# Patient Record
Sex: Female | Born: 1995 | Race: Black or African American | Hispanic: No | Marital: Single | Smoking: Never smoker
Health system: Southern US, Community
[De-identification: ages and names within clinical notes are randomized; demographics above are authoritative.]

## PROBLEM LIST (undated history)

## (undated) DIAGNOSIS — K219 Gastro-esophageal reflux disease without esophagitis: Secondary | ICD-10-CM

## (undated) DIAGNOSIS — J3081 Allergic rhinitis due to animal (cat) (dog) hair and dander: Secondary | ICD-10-CM

## (undated) DIAGNOSIS — G932 Benign intracranial hypertension: Secondary | ICD-10-CM

## (undated) DIAGNOSIS — G5600 Carpal tunnel syndrome, unspecified upper limb: Secondary | ICD-10-CM

## (undated) DIAGNOSIS — G43909 Migraine, unspecified, not intractable, without status migrainosus: Secondary | ICD-10-CM

## (undated) DIAGNOSIS — M797 Fibromyalgia: Secondary | ICD-10-CM

## (undated) HISTORY — DX: Gastro-esophageal reflux disease without esophagitis: K21.9

## (undated) HISTORY — PX: WISDOM TOOTH EXTRACTION: SHX21

## (undated) HISTORY — DX: Carpal tunnel syndrome, unspecified upper limb: G56.00

## (undated) HISTORY — DX: Allergic rhinitis due to animal (cat) (dog) hair and dander: J30.81

---
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
## 2014-01-12 ENCOUNTER — Ambulatory Visit: Payer: Self-pay | Admitting: Pediatrics

## 2014-01-18 ENCOUNTER — Ambulatory Visit: Payer: Self-pay | Admitting: Pediatrics

## 2014-02-06 ENCOUNTER — Encounter: Payer: Self-pay | Admitting: *Deleted

## 2015-11-06 ENCOUNTER — Encounter (HOSPITAL_COMMUNITY): Payer: Self-pay | Admitting: Emergency Medicine

## 2015-11-06 ENCOUNTER — Inpatient Hospital Stay (HOSPITAL_COMMUNITY)
Admission: EM | Admit: 2015-11-06 | Discharge: 2015-11-15 | DRG: 194 | Disposition: A | Discharge status: Home / Self Care | Payer: Medicaid Other | Attending: Internal Medicine | Admitting: Internal Medicine

## 2015-11-06 ENCOUNTER — Emergency Department (HOSPITAL_COMMUNITY): Payer: Medicaid Other

## 2015-11-06 ENCOUNTER — Other Ambulatory Visit: Payer: Self-pay

## 2015-11-06 ENCOUNTER — Other Ambulatory Visit (HOSPITAL_COMMUNITY): Payer: Self-pay

## 2015-11-06 DIAGNOSIS — R000 Tachycardia, unspecified: Secondary | ICD-10-CM | POA: Diagnosis present

## 2015-11-06 DIAGNOSIS — Z79899 Other long term (current) drug therapy: Secondary | ICD-10-CM

## 2015-11-06 DIAGNOSIS — F329 Major depressive disorder, single episode, unspecified: Secondary | ICD-10-CM | POA: Diagnosis not present

## 2015-11-06 DIAGNOSIS — J45901 Unspecified asthma with (acute) exacerbation: Secondary | ICD-10-CM | POA: Diagnosis not present

## 2015-11-06 DIAGNOSIS — G932 Benign intracranial hypertension: Secondary | ICD-10-CM | POA: Diagnosis present

## 2015-11-06 DIAGNOSIS — J181 Lobar pneumonia, unspecified organism: Secondary | ICD-10-CM

## 2015-11-06 DIAGNOSIS — Z6841 Body Mass Index (BMI) 40.0 and over, adult: Secondary | ICD-10-CM

## 2015-11-06 DIAGNOSIS — R0602 Shortness of breath: Secondary | ICD-10-CM | POA: Insufficient documentation

## 2015-11-06 DIAGNOSIS — Z823 Family history of stroke: Secondary | ICD-10-CM

## 2015-11-06 DIAGNOSIS — Z808 Family history of malignant neoplasm of other organs or systems: Secondary | ICD-10-CM

## 2015-11-06 DIAGNOSIS — B370 Candidal stomatitis: Secondary | ICD-10-CM | POA: Diagnosis not present

## 2015-11-06 DIAGNOSIS — Z7951 Long term (current) use of inhaled steroids: Secondary | ICD-10-CM

## 2015-11-06 DIAGNOSIS — G43909 Migraine, unspecified, not intractable, without status migrainosus: Secondary | ICD-10-CM | POA: Diagnosis present

## 2015-11-06 DIAGNOSIS — M797 Fibromyalgia: Secondary | ICD-10-CM | POA: Diagnosis present

## 2015-11-06 DIAGNOSIS — F32A Depression, unspecified: Secondary | ICD-10-CM | POA: Diagnosis present

## 2015-11-06 DIAGNOSIS — F419 Anxiety disorder, unspecified: Secondary | ICD-10-CM | POA: Diagnosis present

## 2015-11-06 DIAGNOSIS — Z885 Allergy status to narcotic agent status: Secondary | ICD-10-CM

## 2015-11-06 DIAGNOSIS — Z91018 Allergy to other foods: Secondary | ICD-10-CM

## 2015-11-06 DIAGNOSIS — D6489 Other specified anemias: Secondary | ICD-10-CM | POA: Diagnosis present

## 2015-11-06 DIAGNOSIS — Z7952 Long term (current) use of systemic steroids: Secondary | ICD-10-CM

## 2015-11-06 DIAGNOSIS — J189 Pneumonia, unspecified organism: Principal | ICD-10-CM

## 2015-11-06 DIAGNOSIS — E876 Hypokalemia: Secondary | ICD-10-CM | POA: Diagnosis present

## 2015-11-06 HISTORY — DX: Benign intracranial hypertension: G93.2

## 2015-11-06 LAB — CBC WITH DIFFERENTIAL/PLATELET
BASOS PCT: 0 %
Basophils Absolute: 0 10*3/uL (ref 0.0–0.1)
EOS ABS: 0 10*3/uL (ref 0.0–0.7)
EOS PCT: 0 %
HCT: 32.8 % — ABNORMAL LOW (ref 36.0–46.0)
Hemoglobin: 10.7 g/dL — ABNORMAL LOW (ref 12.0–15.0)
Lymphocytes Relative: 29 %
Lymphs Abs: 3.1 10*3/uL (ref 0.7–4.0)
MCH: 26.8 pg (ref 26.0–34.0)
MCHC: 32.6 g/dL (ref 30.0–36.0)
MCV: 82.2 fL (ref 78.0–100.0)
MONO ABS: 0.5 10*3/uL (ref 0.1–1.0)
MONOS PCT: 5 %
NEUTROS PCT: 66 %
Neutro Abs: 7.2 10*3/uL (ref 1.7–7.7)
PLATELETS: 311 10*3/uL (ref 150–400)
RBC: 3.99 MIL/uL (ref 3.87–5.11)
RDW: 15.8 % — AB (ref 11.5–15.5)
WBC: 10.9 10*3/uL — ABNORMAL HIGH (ref 4.0–10.5)

## 2015-11-06 LAB — HCG, QUANTITATIVE, PREGNANCY: hCG, Beta Chain, Quant, S: <1 m[IU]/mL (ref <5)

## 2015-11-06 LAB — BASIC METABOLIC PANEL
Anion gap: 6 (ref 5–15)
BUN: 11 mg/dL (ref 6–20)
CALCIUM: 8.9 mg/dL (ref 8.9–10.3)
CO2: 28 mmol/L (ref 22–32)
CREATININE: 0.7 mg/dL (ref 0.44–1.00)
Chloride: 104 mmol/L (ref 101–111)
GFR calc non Af Amer: >60 mL/min (ref >60)
Glucose, Bld: 111 mg/dL — ABNORMAL HIGH (ref 65–99)
Potassium: 3.3 mmol/L — ABNORMAL LOW (ref 3.5–5.1)
SODIUM: 138 mmol/L (ref 135–145)

## 2015-11-06 LAB — I-STAT CHEM 8, ED
BUN: 10 mg/dL (ref 6–20)
CALCIUM ION: 1.12 mmol/L (ref 1.12–1.23)
CHLORIDE: 102 mmol/L (ref 101–111)
CREATININE: 0.7 mg/dL (ref 0.44–1.00)
Glucose, Bld: 98 mg/dL (ref 65–99)
HEMATOCRIT: 33 % — AB (ref 36.0–46.0)
Hemoglobin: 11.2 g/dL — ABNORMAL LOW (ref 12.0–15.0)
Potassium: 3.5 mmol/L (ref 3.5–5.1)
SODIUM: 141 mmol/L (ref 135–145)
TCO2: 26 mmol/L (ref 0–100)

## 2015-11-06 LAB — STREP PNEUMONIAE URINARY ANTIGEN: Strep Pneumo Urinary Antigen: NEGATIVE

## 2015-11-06 MED ORDER — IPRATROPIUM-ALBUTEROL 0.5-2.5 (3) MG/3ML IN SOLN
3.0000 mL | Freq: Four times a day (QID) | RESPIRATORY_TRACT | Status: DC
  Administered 2015-11-06 – 2015-11-09 (×12): 3 mL via RESPIRATORY_TRACT
  Filled 2015-11-06 (×13): qty 3

## 2015-11-06 MED ORDER — POTASSIUM CHLORIDE 20 MEQ/15ML (10%) PO SOLN
20.0000 meq | Freq: Once | ORAL | Status: AC
  Administered 2015-11-06: 20 meq via ORAL
  Filled 2015-11-06: qty 15

## 2015-11-06 MED ORDER — DM-DOXYLAMINE-ACETAMINOPHEN 15-6.25-325 MG/15ML PO LIQD: 30.0000 mL | Freq: Every evening | ORAL | Status: DC | PRN

## 2015-11-06 MED ORDER — MONTELUKAST SODIUM 10 MG PO TABS
10.0000 mg | ORAL_TABLET | Freq: Every day | ORAL | Status: DC
  Administered 2015-11-06 – 2015-11-14 (×9): 10 mg via ORAL
  Filled 2015-11-06 (×10): qty 1

## 2015-11-06 MED ORDER — SODIUM CHLORIDE 0.9 % IV SOLN: INTRAVENOUS | Status: DC

## 2015-11-06 MED ORDER — SODIUM CHLORIDE 0.9 % IV SOLN
INTRAVENOUS | Status: DC
  Administered 2015-11-06: 11:00:00 via INTRAVENOUS

## 2015-11-06 MED ORDER — KETOROLAC TROMETHAMINE 30 MG/ML IJ SOLN
30.0000 mg | Freq: Once | INTRAMUSCULAR | Status: AC
  Administered 2015-11-06: 30 mg via INTRAVENOUS
  Filled 2015-11-06: qty 1

## 2015-11-06 MED ORDER — FLUTICASONE PROPIONATE 50 MCG/ACT NA SUSP
1.0000 | Freq: Every day | NASAL | Status: DC
  Administered 2015-11-06 – 2015-11-09 (×4): 1 via NASAL
  Filled 2015-11-06: qty 16

## 2015-11-06 MED ORDER — GUAIFENESIN-DM 100-10 MG/5ML PO SYRP
5.0000 mL | ORAL_SOLUTION | ORAL | Status: DC | PRN
  Administered 2015-11-06 – 2015-11-13 (×16): 5 mL via ORAL
  Filled 2015-11-06 (×18): qty 10

## 2015-11-06 MED ORDER — PREDNISONE 20 MG PO TABS
60.0000 mg | ORAL_TABLET | Freq: Once | ORAL | Status: AC
  Administered 2015-11-06: 60 mg via ORAL
  Filled 2015-11-06: qty 3

## 2015-11-06 MED ORDER — AZITHROMYCIN 250 MG PO TABS
500.0000 mg | ORAL_TABLET | Freq: Every day | ORAL | Status: AC
  Administered 2015-11-06: 500 mg via ORAL
  Filled 2015-11-06: qty 2

## 2015-11-06 MED ORDER — IPRATROPIUM-ALBUTEROL 0.5-2.5 (3) MG/3ML IN SOLN
3.0000 mL | Freq: Once | RESPIRATORY_TRACT | Status: AC
  Administered 2015-11-06: 3 mL via RESPIRATORY_TRACT
  Filled 2015-11-06: qty 3

## 2015-11-06 MED ORDER — OLOPATADINE HCL 0.1 % OP SOLN
1.0000 [drp] | Freq: Two times a day (BID) | OPHTHALMIC | Status: DC
  Administered 2015-11-07 – 2015-11-15 (×15): 1 [drp] via OPHTHALMIC
  Filled 2015-11-06: qty 5

## 2015-11-06 MED ORDER — IPRATROPIUM-ALBUTEROL 0.5-2.5 (3) MG/3ML IN SOLN
3.0000 mL | RESPIRATORY_TRACT | Status: DC
  Administered 2015-11-06: 3 mL via RESPIRATORY_TRACT
  Filled 2015-11-06: qty 3

## 2015-11-06 MED ORDER — AZITHROMYCIN 250 MG PO TABS
250.0000 mg | ORAL_TABLET | Freq: Every day | ORAL | Status: AC
  Administered 2015-11-07 – 2015-11-10 (×4): 250 mg via ORAL
  Filled 2015-11-06 (×4): qty 1

## 2015-11-06 MED ORDER — LORATADINE 10 MG PO TABS
10.0000 mg | ORAL_TABLET | Freq: Every day | ORAL | Status: DC
  Administered 2015-11-06 – 2015-11-15 (×9): 10 mg via ORAL
  Filled 2015-11-06 (×10): qty 1

## 2015-11-06 MED ORDER — ENOXAPARIN SODIUM 40 MG/0.4ML ~~LOC~~ SOLN
40.0000 mg | SUBCUTANEOUS | Status: DC
  Administered 2015-11-06 – 2015-11-11 (×6): 40 mg via SUBCUTANEOUS
  Filled 2015-11-06 (×7): qty 0.4

## 2015-11-06 MED ORDER — ALBUTEROL SULFATE (2.5 MG/3ML) 0.083% IN NEBU
2.5000 mg | INHALATION_SOLUTION | RESPIRATORY_TRACT | Status: DC | PRN
  Administered 2015-11-06 – 2015-11-10 (×13): 2.5 mg via RESPIRATORY_TRACT
  Filled 2015-11-06 (×13): qty 3

## 2015-11-06 MED ORDER — DM-GUAIFENESIN ER 30-600 MG PO TB12
1.0000 | ORAL_TABLET | Freq: Two times a day (BID) | ORAL | Status: DC
  Administered 2015-11-06 – 2015-11-15 (×18): 1 via ORAL
  Filled 2015-11-06 (×19): qty 1

## 2015-11-06 MED ORDER — METHYLPREDNISOLONE SODIUM SUCC 125 MG IJ SOLR
60.0000 mg | Freq: Three times a day (TID) | INTRAMUSCULAR | Status: DC
  Administered 2015-11-06 – 2015-11-12 (×18): 60 mg via INTRAVENOUS
  Filled 2015-11-06 (×18): qty 2

## 2015-11-06 MED ORDER — IBUPROFEN 800 MG PO TABS
800.0000 mg | ORAL_TABLET | Freq: Four times a day (QID) | ORAL | Status: DC | PRN
  Administered 2015-11-06 – 2015-11-14 (×7): 800 mg via ORAL
  Filled 2015-11-06 (×7): qty 1

## 2015-11-06 NOTE — ED Provider Notes (Signed)
CSN: [id number]     Arrival date & time 11/06/15  1012 History   First MD Initiated Contact with Patient 11/06/15 1035     Chief Complaint  Patient presents with  . Shortness of Breath  . Asthma     (Consider location/radiation/quality/duration/timing/severity/associated sxs/prior Treatment) Patient is a 20 y.o. female presenting with shortness of breath and asthma. The history is provided by the patient.  Shortness of Breath Associated symptoms: chest pain, cough, sore throat and wheezing   Associated symptoms: no abdominal pain, no fever, no headaches, no rash and no vomiting   Asthma Associated symptoms include chest pain and shortness of breath. Pertinent negatives include no abdominal pain and no headaches.  Patient with asthma flareup for the past several days. Seen by primary care Dr. started on prednisone 3 days ago. Still no better. Still wheezing. No distinct fevers but may be related to an upper respiratory infection. And/or allergies. Patient's been using inhalers and patient has been on prednisone.  Past Medical History  Diagnosis Date  . Migraines   . Asthma   . Fibromyalgia   . Pseudotumor cerebri    History reviewed. No pertinent past surgical history. Family History  Problem Relation Age of Onset  . Migraines Mother   . Thyroid cancer Mother   . Fibromyalgia Mother   . Migraines Maternal Aunt   . Stroke Maternal Grandmother   . Other Cousin     Myasthenia gravis   Social History  Substance Use Topics  . Smoking status: Never Smoker   . Smokeless tobacco: None  . Alcohol Use: None   OB History    No data available     Review of Systems  Constitutional: Negative for fever.  HENT: Positive for sore throat.   Eyes: Negative for visual disturbance.  Respiratory: Positive for cough, shortness of breath and wheezing.   Cardiovascular: Positive for chest pain.  Gastrointestinal: Negative for nausea, vomiting and abdominal pain.  Genitourinary: Negative  for dysuria.  Musculoskeletal: Positive for back pain.  Skin: Negative for rash.  Neurological: Negative for headaches.  Hematological: Does not bruise/bleed easily.  Psychiatric/Behavioral: Negative for confusion.      Allergies  Kiwi extract; Pineapple; and Percocet  Home Medications   Prior to Admission medications   Medication Sig Start Date End Date Taking? Authorizing Provider  albuterol (PROVENTIL HFA;VENTOLIN HFA) 108 (90 BASE) MCG/ACT inhaler Inhale 2 puffs into the lungs every 4 (four) hours as needed for wheezing or shortness of breath.    Yes Historical Provider, MD  albuterol (PROVENTIL) (2.5 MG/3ML) 0.083% nebulizer solution Take 2.5 mg by nebulization every 6 (six) hours as needed for wheezing or shortness of breath.   Yes Historical Provider, MD  beclomethasone (QVAR) 40 MCG/ACT inhaler Inhale 2 puffs into the lungs 2 (two) times daily.     Yes Historical Provider, MD  cetirizine (ZYRTEC) 10 MG tablet Take 10 mg by mouth daily.     Yes Historical Provider, MD  DM-Doxylamine-Acetaminophen (VICKS NYQUIL COLD & FLU) 15-6.25-325 MG/15ML LIQD Take 30 mLs by mouth at bedtime as needed (for cold/sleep).   Yes Historical Provider, MD  fluticasone (FLONASE) 50 MCG/ACT nasal spray Place 1 spray into both nostrils daily.   Yes Historical Provider, MD  ibuprofen (ADVIL,MOTRIN) 800 MG tablet Take 800 mg by mouth every 6 (six) hours as needed for fever, headache, mild pain, moderate pain or cramping.   Yes Historical Provider, MD  montelukast (SINGULAIR) 10 MG tablet Take 10 mg by mouth  at bedtime.   Yes Historical Provider, MD  Olopatadine HCl (PATADAY) 0.2 % SOLN Place 2 drops into both eyes daily.   Yes Historical Provider, MD  predniSONE (DELTASONE) 20 MG tablet Take 20 mg by mouth 2 (two) times daily with a meal. Started 06/19 for 5 days   Yes Historical Provider, MD  acetaZOLAMIDE (DIAMOX) 250 MG tablet One by mouth 4 times daily with meals and at bedtime. Patient not taking:  Reported on 11/06/2015 12/14/12   Deetta Perla, MD  DULoxetine (CYMBALTA) 20 MG capsule TAKE 2 CAPS BY MOUTH DAILY Patient not taking: Reported on 11/06/2015 04/11/13   Elveria Rising, NP  furosemide (LASIX) 40 MG tablet Take 1+1/2 tablets every morning Patient not taking: Reported on 11/06/2015 04/11/13   Elveria Rising, NP  traMADol (ULTRAM) 50 MG tablet TAKE 2 TABLETS BY MOUTH AT BEDTIME Patient not taking: Reported on 11/06/2015 04/11/13   Elveria Rising, NP   BP 124/65 mmHg  Pulse 92  Temp(Src) 98.6 F (37 C) (Oral)  Resp 23  SpO2 98%  LMP 10/28/2015 (Approximate) Physical Exam  Constitutional: She is oriented to person, place, and time. She appears well-developed and well-nourished. No distress.  HENT:  Head: Normocephalic and atraumatic.  Mouth/Throat: Oropharynx is clear and moist.  Eyes: Conjunctivae are normal. Pupils are equal, round, and reactive to light.  Neck: Normal range of motion. Neck supple.  Cardiovascular: Normal rate, regular rhythm and normal heart sounds.   No murmur heard. Pulmonary/Chest: Effort normal. She has wheezes.  Abdominal: Soft. There is no tenderness.  Musculoskeletal: Normal range of motion. She exhibits no edema.  Neurological: She is alert and oriented to person, place, and time. No cranial nerve deficit. She exhibits normal muscle tone. Coordination normal.  Nursing note and vitals reviewed.   ED Course  Procedures (including critical care time) Labs Review Labs Reviewed  CBC WITH DIFFERENTIAL/PLATELET - Abnormal; Notable for the following:    WBC 10.9 (*)    Hemoglobin 10.7 (*)    HCT 32.8 (*)    RDW 15.8 (*)    All other components within normal limits  BASIC METABOLIC PANEL - Abnormal; Notable for the following:    Potassium 3.3 (*)    Glucose, Bld 111 (*)    All other components within normal limits  I-STAT CHEM 8, ED - Abnormal; Notable for the following:    Hemoglobin 11.2 (*)    HCT 33.0 (*)    All other components  within normal limits   Results for orders placed or performed during the hospital encounter of 11/06/15  CBC with Differential/Platelet  Result Value Ref Range   WBC 10.9 (H) 4.0 - 10.5 K/uL   RBC 3.99 3.87 - 5.11 MIL/uL   Hemoglobin 10.7 (L) 12.0 - 15.0 g/dL   HCT 40.9 (L) 81.1 - 91.4 %   MCV 82.2 78.0 - 100.0 fL   MCH 26.8 26.0 - 34.0 pg   MCHC 32.6 30.0 - 36.0 g/dL   RDW 78.2 (H) 95.6 - 21.3 %   Platelets 311 150 - 400 K/uL   Neutrophils Relative % 66 %   Neutro Abs 7.2 1.7 - 7.7 K/uL   Lymphocytes Relative 29 %   Lymphs Abs 3.1 0.7 - 4.0 K/uL   Monocytes Relative 5 %   Monocytes Absolute 0.5 0.1 - 1.0 K/uL   Eosinophils Relative 0 %   Eosinophils Absolute 0.0 0.0 - 0.7 K/uL   Basophils Relative 0 %   Basophils Absolute 0.0 0.0 -  0.1 K/uL  Basic metabolic panel  Result Value Ref Range   Sodium 138 135 - 145 mmol/L   Potassium 3.3 (L) 3.5 - 5.1 mmol/L   Chloride 104 101 - 111 mmol/L   CO2 28 22 - 32 mmol/L   Glucose, Bld 111 (H) 65 - 99 mg/dL   BUN 11 6 - 20 mg/dL   Creatinine, Ser 6.040.70 0.44 - 1.00 mg/dL   Calcium 8.9 8.9 - 54.010.3 mg/dL   GFR calc non Af Amer >60 >60 mL/min   GFR calc Af Amer >60 >60 mL/min   Anion gap 6 5 - 15  I-Stat Chem 8, ED  Result Value Ref Range   Sodium 141 135 - 145 mmol/L   Potassium 3.5 3.5 - 5.1 mmol/L   Chloride 102 101 - 111 mmol/L   BUN 10 6 - 20 mg/dL   Creatinine, Ser 9.810.70 0.44 - 1.00 mg/dL   Glucose, Bld 98 65 - 99 mg/dL   Calcium, Ion 1.911.12 4.781.12 - 1.23 mmol/L   TCO2 26 0 - 100 mmol/L   Hemoglobin 11.2 (L) 12.0 - 15.0 g/dL   HCT 29.533.0 (L) 62.136.0 - 30.846.0 %     Imaging Review Dg Chest 2 View  11/06/2015  CLINICAL DATA:  Increasing cough, congestion and shortness of breath over the past 4 days. EXAM: CHEST  2 VIEW COMPARISON:  PA and lateral chest 05/21/2011 and 03/03/2010. FINDINGS: The lungs are clear. Heart size is normal. No pneumothorax or pleural effusion. No bony abnormality. IMPRESSION: Negative chest. Electronically Signed    By: Drusilla Kannerhomas  Dalessio M.D.   On: 11/06/2015 11:35   I have personally reviewed and evaluated these images and lab results as part of my medical decision-making.   EKG Interpretation None      MDM   Final diagnoses:  Asthma exacerbation   Patient with known history of asthma persistent wheezing despite being on steroids for the past 3 days. Patient received 2 nebulizer treatments here with albuterol and Atrovent wheezing persists. Patient will be admitted by the hospitalist for further treatment. Chest x-ray negative for pneumonia or pneumothorax. Labs without significant abnormalities.    Vanetta MuldersScott Deaire Mcwhirter, MD 11/06/15 [phone]

## 2015-11-06 NOTE — ED Notes (Signed)
Pt's has had asthma flare-up for past several days. Went to doctor 3 days ago and was given rx for prednisone. Pt's symptoms of L side wheezing and sob unrelieved. Has tried home nebulizers with no relief. Small amount of brown sputum with cough. Wheezing and rhonchi auscaltated bilaterally.

## 2015-11-06 NOTE — H&P (Signed)
History and Physical    Carrie MuskratShaunja D Sorey ZOX:[id number] DOB: 05/17/1996 DOA: 11/06/2015  Referring MD/NP/PA:   PCP: Lyda PeroneEES,JANET L, MD   Patient coming from:  The patient is coming from home.  At baseline, pt is independent for her ADL.   Chief Complaint: Wheezing, cough and shortness of breath   HPI: Carrie [name] is a 20 y.o. female with medical history significant of asthma, depression, migraine headache and pseudotumor tumor cer tumor cerebri, fibromyalgia, who presents, wheezing  and SOB.  Patient reports that she has been having cough, wheezing and shortness of breath for several days, which has progressively getting worse. She was seen by her PCP 3 days ago, and given prescription for prednisone. She has been using inhaler and prednisone without any improvement. Patient coughs up brownish colored sputum, no chest pain, ffever or chills. Her shortness of breath has been progressively getting worse. She can still speak full sentences. Patient does not have vomiting, diarrhea, symptoms of UTI or unilateral weakness. She has mild nausea.   ED Course: pt was found to have  WBC 10.9, temperature normal, oxygen saturation 98% on room air, potassium 3.3, creatinine normal, negative chest x-ray for acute abnormalities. Patient is placed on telemetry bed for observation.  Review of Systems:   General: no fevers, chills, no changes in body weight, has fatigue HEENT: no blurry vision, hearing changes or sore throat Pulm: has dyspnea, coughing, wheezing CV: no chest pain, no palpitations Abd: has nausea, no vomiting, abdominal pain, diarrhea, constipation GU: no dysuria, burning on urination, increased urinary frequency, hematuria  Ext: no leg edema Neuro: no unilateral weakness, numbness, or tingling, no vision change or hearing loss Skin: no rash MSK: No muscle spasm, no deformity, no limitation of range of movement in spin Heme: No easy bruising.  Travel history: No recent long  distant travel.  Allergy:  Allergies  Allergen Reactions  . Kiwi Extract Itching    Tongue itching   . Pineapple Itching    Tongue itching   . Other     PT IS A JEHOVAH WITNESS. NO BLOOD PRODUCTS  . Percocet [Oxycodone-Acetaminophen] Other (See Comments)    vomitting and hard to arouse    Past Medical History  Diagnosis Date  . Migraines   . Asthma   . Fibromyalgia   . Pseudotumor cerebri     History reviewed. No pertinent past surgical history.  Social History:  reports that she has never smoked. She does not have any smokeless tobacco history on file. Her alcohol and drug histories are not on file.  Family History:  Family History  Problem Relation Age of Onset  . Migraines Mother   . Thyroid cancer Mother   . Fibromyalgia Mother   . Migraines Maternal Aunt   . Stroke Maternal Grandmother   . Other Cousin     Myasthenia gravis     Prior to Admission medications   Medication Sig Start Date End Date Taking? Authorizing Provider  albuterol (PROVENTIL HFA;VENTOLIN HFA) 108 (90 BASE) MCG/ACT inhaler Inhale 2 puffs into the lungs every 4 (four) hours as needed for wheezing or shortness of breath.    Yes Historical Provider, MD  albuterol (PROVENTIL) (2.5 MG/3ML) 0.083% nebulizer solution Take 2.5 mg by nebulization every 6 (six) hours as needed for wheezing or shortness of breath.   Yes Historical Provider, MD  beclomethasone (QVAR) 40 MCG/ACT inhaler Inhale 2 puffs into the lungs 2 (two) times daily.     Yes Historical Provider, MD  cetirizine (ZYRTEC) 10 MG tablet Take 10 mg by mouth daily.     Yes Historical Provider, MD  DM-Doxylamine-Acetaminophen (VICKS NYQUIL COLD & FLU) 15-6.25-325 MG/15ML LIQD Take 30 mLs by mouth at bedtime as needed (for cold/sleep).   Yes Historical Provider, MD  fluticasone (FLONASE) 50 MCG/ACT nasal spray Place 1 spray into both nostrils daily.   Yes Historical Provider, MD  ibuprofen (ADVIL,MOTRIN) 800 MG tablet Take 800 mg by mouth every 6  (six) hours as needed for fever, headache, mild pain, moderate pain or cramping.   Yes Historical Provider, MD  montelukast (SINGULAIR) 10 MG tablet Take 10 mg by mouth at bedtime.   Yes Historical Provider, MD  Olopatadine HCl (PATADAY) 0.2 % SOLN Place 2 drops into both eyes daily.   Yes Historical Provider, MD  predniSONE (DELTASONE) 20 MG tablet Take 20 mg by mouth 2 (two) times daily with a meal. Started 06/19 for 5 days   Yes Historical Provider, MD  acetaZOLAMIDE (DIAMOX) 250 MG tablet One by mouth 4 times daily with meals and at bedtime. Patient not taking: Reported on 11/06/2015 12/14/12   Deetta Perla, MD  DULoxetine (CYMBALTA) 20 MG capsule TAKE 2 CAPS BY MOUTH DAILY Patient not taking: Reported on 11/06/2015 04/11/13   Elveria Rising, NP  furosemide (LASIX) 40 MG tablet Take 1+1/2 tablets every morning Patient not taking: Reported on 11/06/2015 04/11/13   Elveria Rising, NP  traMADol (ULTRAM) 50 MG tablet TAKE 2 TABLETS BY MOUTH AT BEDTIME Patient not taking: Reported on 11/06/2015 04/11/13   Elveria Rising, NP    Physical Exam: Filed Vitals:   11/06/15 1603 11/06/15 1604 11/06/15 1612 11/06/15 1636  BP:   123/65 127/55  Pulse:   74 71  Temp:   98.3 F (36.8 C) 99.3 F (37.4 C)  TempSrc:   Oral Oral  Resp:   25 22  Height:    5' (1.524 m)  Weight:    135.2 kg (298 lb 1 oz)  SpO2: 99% 99% 96% 99%   General: Not in acute distress HEENT:       Eyes: PERRL, EOMI, no scleral icterus.       ENT: No discharge from the ears and nose, no pharynx injection, no tonsillar enlargement.        Neck: No JVD, no bruit, no mass felt. Heme: No neck lymph node enlargement. Cardiac: S1/S2, RRR, No murmurs, No gallops or rubs. Pulm: Has diffuse wheezing bilaterally. No rales or rubs. Abd: Soft, nondistended, nontender, no rebound pain, no organomegaly, BS present. GU: No hematuria Ext: No pitting leg edema bilaterally. 2+DP/PT pulse bilaterally. Musculoskeletal: No joint  deformities, No joint redness or warmth, no limitation of ROM in spin. Skin: No rashes.  Neuro: Alert, oriented X3, cranial nerves II-XII grossly intact, moves all extremities normally. Psych: Patient is not psychotic, no suicidal or hemocidal ideation.  Labs on Admission: I have personally reviewed following labs and imaging studies  CBC:  Recent Labs Lab 11/06/15 1105 11/06/15 1344  WBC 10.9*  --   NEUTROABS 7.2  --   HGB 10.7* 11.2*  HCT 32.8* 33.0*  MCV 82.2  --   PLT 311  --    Basic Metabolic Panel:  Recent Labs Lab 11/06/15 1105 11/06/15 1344  NA 138 141  K 3.3* 3.5  CL 104 102  CO2 28  --   GLUCOSE 111* 98  BUN 11 10  CREATININE 0.70 0.70  CALCIUM 8.9  --    GFR: Estimated  Creatinine Clearance: 144.1 mL/min (by C-G formula based on Cr of 0.7). Liver Function Tests: No results for input(s): AST, ALT, ALKPHOS, BILITOT, PROT, ALBUMIN in the last 168 hours. No results for input(s): LIPASE, AMYLASE in the last 168 hours. No results for input(s): AMMONIA in the last 168 hours. Coagulation Profile: No results for input(s): INR, PROTIME in the last 168 hours. Cardiac Enzymes: No results for input(s): CKTOTAL, CKMB, CKMBINDEX, TROPONINI in the last 168 hours. BNP (last 3 results) No results for input(s): PROBNP in the last 8760 hours. HbA1C: No results for input(s): HGBA1C in the last 72 hours. CBG: No results for input(s): GLUCAP in the last 168 hours. Lipid Profile: No results for input(s): CHOL, HDL, LDLCALC, TRIG, CHOLHDL, LDLDIRECT in the last 72 hours. Thyroid Function Tests: No results for input(s): TSH, T4TOTAL, FREET4, T3FREE, THYROIDAB in the last 72 hours. Anemia Panel: No results for input(s): VITAMINB12, FOLATE, FERRITIN, TIBC, IRON, RETICCTPCT in the last 72 hours. Urine analysis:    Component Value Date/Time   COLORURINE YELLOW 09/16/2009 1708   APPEARANCEUR CLEAR 09/16/2009 1708   LABSPEC 1.025 09/16/2009 1708   PHURINE 6.5 09/16/2009 1708    GLUCOSEU NEGATIVE 09/16/2009 1708   HGBUR TRACE* 09/16/2009 1708   BILIRUBINUR NEGATIVE 09/16/2009 1708   KETONESUR NEGATIVE 09/16/2009 1708   PROTEINUR NEGATIVE 09/16/2009 1708   UROBILINOGEN 0.2 09/16/2009 1708   NITRITE NEGATIVE 09/16/2009 1708   LEUKOCYTESUR NEGATIVE 09/16/2009 1708   Sepsis Labs: @LABRCNTIP (procalcitonin:4,lacticidven:4) )No results found for this or any previous visit (from the past 240 hour(s)).   Radiological Exams on Admission: Dg Chest 2 View  11/06/2015  CLINICAL DATA:  Increasing cough, congestion and shortness of breath over the past 4 days. EXAM: CHEST  2 VIEW COMPARISON:  PA and lateral chest 05/21/2011 and 03/03/2010. FINDINGS: The lungs are clear. Heart size is normal. No pneumothorax or pleural effusion. No bony abnormality. IMPRESSION: Negative chest. Electronically Signed   By: Drusilla Kanner M.D.   On: 11/06/2015 11:35     EKG: Independently reviewed. Sinus rhythm, QTC 438, mild T-wave inversion in V2-V3.  Assessment/Plan Principal Problem:   Asthma exacerbation Active Problems:   Pseudotumor cerebri   Migraine   Morbid obesity (HCC)   Depression   Hypokalemia  Asthma exacerbation: Patient's cough, shortness of breath and wheezing are consistent with asthma exacerbation. CXR is negative for infiltration. No chest pain, unlikely to have pulmonary embolism.  -will place patient on telemetry bed for obs -Nebulizers: scheduled Duoneb and prn albuterol -continue Singulair -Solu-Medrol 60 mg IV tid -Oral azithromycin for 5 days.  -Mucinex for cough  -Urine S. pneumococcal antigen -Follow up blood culture x2, sputum culture, respiratory virus panel -check preg test  Pseudotumor cerebri: pt was followed up by neurology PA, Filbert Berthold. Used to be on lasix and diamox, which she has taken for 1 year. She has mild HA today, which she attributed to her migraine headache. -f/u  neurology PA, Alisson Eervin.  Migraine HA: has mild HA - prn  ibuprofen  Depression: Stable, no suicidal or homicidal ideations. Used to be on Cymbalta, but not taking it currently - observe closely.   Hypokalemia: K= 3.3 on admission. - Repleted - Check Mg level  DVT ppx: SQ Lovenox Code Status: Full code Family Communication: Yes, patient's mother at bed side Disposition Plan:  Anticipate discharge back to previous home environment Consults called:  none Admission status: Obs / tele  Date of Service 11/06/2015    Lorretta Harp [facility] Pager  [phone]  If 7PM-7AM, please contact night-coverage [email or web address] Password [facility] 11/06/2015, 5:43 PM

## 2015-11-06 NOTE — ED Notes (Signed)
15:05 pt can go to floor. 

## 2015-11-07 DIAGNOSIS — Z6841 Body Mass Index (BMI) 40.0 and over, adult: Secondary | ICD-10-CM | POA: Diagnosis not present

## 2015-11-07 DIAGNOSIS — D6489 Other specified anemias: Secondary | ICD-10-CM | POA: Diagnosis present

## 2015-11-07 DIAGNOSIS — B370 Candidal stomatitis: Secondary | ICD-10-CM | POA: Diagnosis not present

## 2015-11-07 DIAGNOSIS — J189 Pneumonia, unspecified organism: Secondary | ICD-10-CM | POA: Diagnosis present

## 2015-11-07 DIAGNOSIS — J45901 Unspecified asthma with (acute) exacerbation: Secondary | ICD-10-CM | POA: Diagnosis present

## 2015-11-07 DIAGNOSIS — M797 Fibromyalgia: Secondary | ICD-10-CM | POA: Diagnosis present

## 2015-11-07 DIAGNOSIS — Z79899 Other long term (current) drug therapy: Secondary | ICD-10-CM | POA: Diagnosis not present

## 2015-11-07 DIAGNOSIS — G43909 Migraine, unspecified, not intractable, without status migrainosus: Secondary | ICD-10-CM | POA: Diagnosis present

## 2015-11-07 DIAGNOSIS — F419 Anxiety disorder, unspecified: Secondary | ICD-10-CM | POA: Diagnosis present

## 2015-11-07 DIAGNOSIS — F329 Major depressive disorder, single episode, unspecified: Secondary | ICD-10-CM | POA: Diagnosis present

## 2015-11-07 DIAGNOSIS — E876 Hypokalemia: Secondary | ICD-10-CM | POA: Diagnosis present

## 2015-11-07 DIAGNOSIS — Z7952 Long term (current) use of systemic steroids: Secondary | ICD-10-CM | POA: Diagnosis not present

## 2015-11-07 DIAGNOSIS — Z7951 Long term (current) use of inhaled steroids: Secondary | ICD-10-CM | POA: Diagnosis not present

## 2015-11-07 DIAGNOSIS — G932 Benign intracranial hypertension: Secondary | ICD-10-CM | POA: Diagnosis present

## 2015-11-07 DIAGNOSIS — Z823 Family history of stroke: Secondary | ICD-10-CM | POA: Diagnosis not present

## 2015-11-07 DIAGNOSIS — Z808 Family history of malignant neoplasm of other organs or systems: Secondary | ICD-10-CM | POA: Diagnosis not present

## 2015-11-07 DIAGNOSIS — R000 Tachycardia, unspecified: Secondary | ICD-10-CM | POA: Diagnosis present

## 2015-11-07 DIAGNOSIS — G43809 Other migraine, not intractable, without status migrainosus: Secondary | ICD-10-CM | POA: Diagnosis not present

## 2015-11-07 DIAGNOSIS — Z91018 Allergy to other foods: Secondary | ICD-10-CM | POA: Diagnosis not present

## 2015-11-07 DIAGNOSIS — Z885 Allergy status to narcotic agent status: Secondary | ICD-10-CM | POA: Diagnosis not present

## 2015-11-07 LAB — RESPIRATORY PANEL BY PCR
ADENOVIRUS-RVPPCR: NOT DETECTED
Bordetella pertussis: NOT DETECTED
CHLAMYDOPHILA PNEUMONIAE-RVPPCR: NOT DETECTED
CORONAVIRUS 229E-RVPPCR: NOT DETECTED
CORONAVIRUS NL63-RVPPCR: NOT DETECTED
CORONAVIRUS OC43-RVPPCR: NOT DETECTED
Coronavirus HKU1: NOT DETECTED
INFLUENZA A H1 2009-RVPPR: NOT DETECTED
INFLUENZA A H1-RVPPCR: NOT DETECTED
INFLUENZA A H3-RVPPCR: NOT DETECTED
INFLUENZA B-RVPPCR: NOT DETECTED
Influenza A: NOT DETECTED
MYCOPLASMA PNEUMONIAE-RVPPCR: NOT DETECTED
Metapneumovirus: NOT DETECTED
PARAINFLUENZA VIRUS 1-RVPPCR: NOT DETECTED
Parainfluenza Virus 2: NOT DETECTED
Parainfluenza Virus 3: NOT DETECTED
Parainfluenza Virus 4: NOT DETECTED
Respiratory Syncytial Virus: NOT DETECTED
Rhinovirus / Enterovirus: NOT DETECTED

## 2015-11-07 LAB — HIV ANTIBODY (ROUTINE TESTING W REFLEX): HIV Screen 4th Generation wRfx: NONREACTIVE

## 2015-11-07 LAB — MAGNESIUM: Magnesium: 2.1 mg/dL (ref 1.7–2.4)

## 2015-11-07 MED ORDER — PANTOPRAZOLE SODIUM 40 MG PO TBEC
40.0000 mg | DELAYED_RELEASE_TABLET | Freq: Every day | ORAL | Status: DC
  Administered 2015-11-07 – 2015-11-15 (×9): 40 mg via ORAL
  Filled 2015-11-07 (×9): qty 1

## 2015-11-07 MED ORDER — HYDROCOD POLST-CPM POLST ER 10-8 MG/5ML PO SUER
5.0000 mL | Freq: Two times a day (BID) | ORAL | Status: DC | PRN
  Administered 2015-11-07 – 2015-11-15 (×15): 5 mL via ORAL
  Filled 2015-11-07 (×15): qty 5

## 2015-11-07 MED ORDER — TRAMADOL HCL 50 MG PO TABS
50.0000 mg | ORAL_TABLET | Freq: Four times a day (QID) | ORAL | Status: DC | PRN
  Administered 2015-11-07 – 2015-11-15 (×17): 50 mg via ORAL
  Filled 2015-11-07 (×17): qty 1

## 2015-11-07 MED ORDER — POLYETHYLENE GLYCOL 3350 17 G PO PACK
17.0000 g | PACK | Freq: Every day | ORAL | Status: DC
  Administered 2015-11-07 – 2015-11-14 (×8): 17 g via ORAL
  Filled 2015-11-07 (×10): qty 1

## 2015-11-07 MED ORDER — BENZONATATE 100 MG PO CAPS
200.0000 mg | ORAL_CAPSULE | Freq: Three times a day (TID) | ORAL | Status: DC
  Administered 2015-11-07 – 2015-11-15 (×24): 200 mg via ORAL
  Filled 2015-11-07 (×26): qty 2

## 2015-11-07 MED ORDER — ALBUTEROL (5 MG/ML) CONTINUOUS INHALATION SOLN
10.0000 mg/h | INHALATION_SOLUTION | RESPIRATORY_TRACT | Status: AC
  Administered 2015-11-08: 10 mg/h via RESPIRATORY_TRACT
  Filled 2015-11-07 (×2): qty 20

## 2015-11-07 MED ORDER — PHENOL 1.4 % MT LIQD
1.0000 | OROMUCOSAL | Status: DC | PRN
  Administered 2015-11-07: 1 via OROMUCOSAL
  Filled 2015-11-07: qty 177

## 2015-11-07 NOTE — Progress Notes (Signed)
Pt HR sustaining in the 140's with coughing and ambulation.  Goes back down in the low 100's with rest.  MD made aware.  Will continue to monitor closely.

## 2015-11-07 NOTE — Progress Notes (Addendum)
Pt states she only need an neb machine, there are no other needs.  Pt has Medicaid insurance. [facility]

## 2015-11-07 NOTE — Progress Notes (Signed)
PROGRESS NOTE    Carrie [name]  ZOX:[id number] DOB: 1995-08-11 DOA: 11/06/2015 PCP: Lyda Perone, MD   Brief Narrative:  HPI on 11/06/2015 by Dr. Ronnald Collum is a 20 y.o. female with medical history significant of asthma, depression, migraine headache and pseudotumor tumor cer tumor cerebri, fibromyalgia, who presents, wheezing and SOB. Patient reports that she has been having cough, wheezing and shortness of breath for several days, which has progressively getting worse. She was seen by her PCP 3 days ago, and given prescription for prednisone. She has been using inhaler and prednisone without any improvement. Patient coughs up brownish colored sputum, no chest pain, ffever or chills. Her shortness of breath has been progressively getting worse. She can still speak full sentences. Patient does not have vomiting, diarrhea, symptoms of UTI or unilateral weakness. She has mild nausea.   Assessment & Plan  Asthma exacerbation -Patient's cough, shortness of breath and wheezing are consistent with asthma exacerbation.  -CXR is negative for infiltration.  -Continue nebs, singular, solumedrol, azithromycin, mucinex -strep pneumococcal urine antigen negative -Respiratory viral panel negative -Blood cultures pending  Pseudotumor cerebri  -patient was followed up by neurology PA, Filbert Berthold. Used to be on lasix and diamox, which she has taken for 1 year.  -f/u neurology PA, Alisson Eervin.  Migraine HA  -No complaints today, continue ibuprofen PRN  Depression -Stable, currently on no medications -Was on cymbalta  Hypokalemia -Resolved, continue to monitor and replace as needed  Normocytic anemia -Based and hemoglobin proximally 11, currently 11.2  -continue monitor CBC  DVT Prophylaxis  Lovenox  Code Status: Full  Family Communication: Mother at bedside  Disposition Plan: Admitted for observation  Consultants None  Procedures  None  Antibiotics     Anti-infectives    Start     Dose/Rate Route Frequency Ordered Stop   11/07/15 1000  azithromycin (ZITHROMAX) tablet 250 mg     250 mg Oral Daily 11/06/15 1456 11/11/15 0959   11/06/15 1600  azithromycin (ZITHROMAX) tablet 500 mg     500 mg Oral Daily 11/06/15 1456 11/06/15 1700      Subjective:   Carrie [name] seen and examined today.  Patient continues to complain of shortness of breath and left sided pain.  She has nonproductive cough. Denies chest pain, abdominal pain, nausea, vomiting.     Objective:   Filed Vitals:   11/06/15 2134 11/07/15 0543 11/07/15 0733 11/07/15 1038  BP: 95/58 124/65    Pulse: 68 65    Temp: 99 F (37.2 C) 98.4 F (36.9 C)    TempSrc: Oral Oral    Resp: 22 22    Height:      Weight:      SpO2: 98% 98% 98% 97%    Intake/Output Summary (Last 24 hours) at 11/07/15 1147 Last data filed at 11/07/15 0900  Gross per 24 hour  Intake    840 ml  Output   2575 ml  Net  -1735 ml   Filed Weights   11/06/15 1636  Weight: 135.2 kg (298 lb 1 oz)    Exam  General: Well developed, well nourished, NAD  HEENT: NCAT, mucous membranes moist.   Cardiovascular: S1 S2 auscultated, no murmurs, RRR  Respiratory: Expiratory wheezing, limited air movement  Abdomen: Soft, obese, nontender, nondistended, + bowel sounds  Extremities: warm dry without cyanosis clubbing or edema  Neuro: AAOx3, nonfocal  Psych: Normal affect and demeanor    Data Reviewed: I have personally reviewed following  labs and imaging studies  CBC:  Recent Labs Lab 11/06/15 1105 11/06/15 1344  WBC 10.9*  --   NEUTROABS 7.2  --   HGB 10.7* 11.2*  HCT 32.8* 33.0*  MCV 82.2  --   PLT 311  --    Basic Metabolic Panel:  Recent Labs Lab 11/06/15 1105 11/06/15 1344 11/07/15 0458  NA 138 141  --   K 3.3* 3.5  --   CL 104 102  --   CO2 28  --   --   GLUCOSE 111* 98  --   BUN 11 10  --   CREATININE 0.70 0.70  --   CALCIUM 8.9  --   --   MG  --   --  2.1    GFR: Estimated Creatinine Clearance: 144.1 mL/min (by C-G formula based on Cr of 0.7). Liver Function Tests: No results for input(s): AST, ALT, ALKPHOS, BILITOT, PROT, ALBUMIN in the last 168 hours. No results for input(s): LIPASE, AMYLASE in the last 168 hours. No results for input(s): AMMONIA in the last 168 hours. Coagulation Profile: No results for input(s): INR, PROTIME in the last 168 hours. Cardiac Enzymes: No results for input(s): CKTOTAL, CKMB, CKMBINDEX, TROPONINI in the last 168 hours. BNP (last 3 results) No results for input(s): PROBNP in the last 8760 hours. HbA1C: No results for input(s): HGBA1C in the last 72 hours. CBG: No results for input(s): GLUCAP in the last 168 hours. Lipid Profile: No results for input(s): CHOL, HDL, LDLCALC, TRIG, CHOLHDL, LDLDIRECT in the last 72 hours. Thyroid Function Tests: No results for input(s): TSH, T4TOTAL, FREET4, T3FREE, THYROIDAB in the last 72 hours. Anemia Panel: No results for input(s): VITAMINB12, FOLATE, FERRITIN, TIBC, IRON, RETICCTPCT in the last 72 hours. Urine analysis:    Component Value Date/Time   COLORURINE YELLOW 09/16/2009 1708   APPEARANCEUR CLEAR 09/16/2009 1708   LABSPEC 1.025 09/16/2009 1708   PHURINE 6.5 09/16/2009 1708   GLUCOSEU NEGATIVE 09/16/2009 1708   HGBUR TRACE* 09/16/2009 1708   BILIRUBINUR NEGATIVE 09/16/2009 1708   KETONESUR NEGATIVE 09/16/2009 1708   PROTEINUR NEGATIVE 09/16/2009 1708   UROBILINOGEN 0.2 09/16/2009 1708   NITRITE NEGATIVE 09/16/2009 1708   LEUKOCYTESUR NEGATIVE 09/16/2009 1708   Sepsis Labs: @LABRCNTIP (procalcitonin:4,lacticidven:4)  ) Recent Results (from the past 240 hour(s))  Respiratory Panel by PCR     Status: None   Collection Time: 11/06/15  5:42 PM  Result Value Ref Range Status   Adenovirus NOT DETECTED NOT DETECTED Final   Coronavirus 229E NOT DETECTED NOT DETECTED Final   Coronavirus HKU1 NOT DETECTED NOT DETECTED Final   Coronavirus NL63 NOT DETECTED  NOT DETECTED Final   Coronavirus OC43 NOT DETECTED NOT DETECTED Final   Metapneumovirus NOT DETECTED NOT DETECTED Final   Rhinovirus / Enterovirus NOT DETECTED NOT DETECTED Final   Influenza A NOT DETECTED NOT DETECTED Final   Influenza A H1 NOT DETECTED NOT DETECTED Final   Influenza A H1 2009 NOT DETECTED NOT DETECTED Final   Influenza A H3 NOT DETECTED NOT DETECTED Final   Influenza B NOT DETECTED NOT DETECTED Final   Parainfluenza Virus 1 NOT DETECTED NOT DETECTED Final   Parainfluenza Virus 2 NOT DETECTED NOT DETECTED Final   Parainfluenza Virus 3 NOT DETECTED NOT DETECTED Final   Parainfluenza Virus 4 NOT DETECTED NOT DETECTED Final   Respiratory Syncytial Virus NOT DETECTED NOT DETECTED Final   Bordetella pertussis NOT DETECTED NOT DETECTED Final   Chlamydophila pneumoniae NOT DETECTED NOT DETECTED Final  Mycoplasma pneumoniae NOT DETECTED NOT DETECTED Final      Radiology Studies: Dg Chest 2 View  11/06/2015  CLINICAL DATA:  Increasing cough, congestion and shortness of breath over the past 4 days. EXAM: CHEST  2 VIEW COMPARISON:  PA and lateral chest 05/21/2011 and 03/03/2010. FINDINGS: The lungs are clear. Heart size is normal. No pneumothorax or pleural effusion. No bony abnormality. IMPRESSION: Negative chest. Electronically Signed   By: Drusilla Kannerhomas  Dalessio M.D.   On: 11/06/2015 11:35     Scheduled Meds: . azithromycin  250 mg Oral Daily  . dextromethorphan-guaiFENesin  1 tablet Oral BID  . enoxaparin (LOVENOX) injection  40 mg Subcutaneous Q24H  . fluticasone  1 spray Each Nare Daily  . ipratropium-albuterol  3 mL Nebulization QID  . loratadine  10 mg Oral Daily  . methylPREDNISolone (SOLU-MEDROL) injection  60 mg Intravenous TID  . montelukast  10 mg Oral QHS  . olopatadine  1 drop Both Eyes BID  . pantoprazole  40 mg Oral Daily  . polyethylene glycol  17 g Oral Daily   Continuous Infusions:      Time Spent in minutes   30 minutes  Ozelle Brubacher D.O. on  11/07/2015 at 11:47 AM  Between 7am to 7pm - Pager - [phone]  After 7pm go to [email or web address] - password [email or web address]  And look for the night coverage person covering for me after hours  [facility] Office  [phone]

## 2015-11-08 ENCOUNTER — Inpatient Hospital Stay (HOSPITAL_COMMUNITY): Payer: Medicaid Other

## 2015-11-08 LAB — EXPECTORATED SPUTUM ASSESSMENT W REFEX TO RESP CULTURE

## 2015-11-08 LAB — CBC
HCT: 32.8 % — ABNORMAL LOW (ref 36.0–46.0)
HEMOGLOBIN: 10.7 g/dL — AB (ref 12.0–15.0)
MCH: 27.1 pg (ref 26.0–34.0)
MCHC: 32.6 g/dL (ref 30.0–36.0)
MCV: 83 fL (ref 78.0–100.0)
PLATELETS: 332 10*3/uL (ref 150–400)
RBC: 3.95 MIL/uL (ref 3.87–5.11)
RDW: 16.1 % — ABNORMAL HIGH (ref 11.5–15.5)
WBC: 12.5 10*3/uL — AB (ref 4.0–10.5)

## 2015-11-08 LAB — BASIC METABOLIC PANEL
ANION GAP: 9 (ref 5–15)
BUN: 14 mg/dL (ref 6–20)
CHLORIDE: 102 mmol/L (ref 101–111)
CO2: 25 mmol/L (ref 22–32)
Calcium: 9 mg/dL (ref 8.9–10.3)
Creatinine, Ser: 0.69 mg/dL (ref 0.44–1.00)
Glucose, Bld: 146 mg/dL — ABNORMAL HIGH (ref 65–99)
POTASSIUM: 3.7 mmol/L (ref 3.5–5.1)
SODIUM: 136 mmol/L (ref 135–145)

## 2015-11-08 LAB — EXPECTORATED SPUTUM ASSESSMENT W GRAM STAIN, RFLX TO RESP C

## 2015-11-08 MED ORDER — ALBUTEROL (5 MG/ML) CONTINUOUS INHALATION SOLN
5.0000 mg/h | INHALATION_SOLUTION | RESPIRATORY_TRACT | Status: DC
Start: 2015-11-08 — End: 2015-11-15
  Administered 2015-11-08: 5 mg/h via RESPIRATORY_TRACT
  Filled 2015-11-08: qty 20

## 2015-11-08 MED ORDER — KETOROLAC TROMETHAMINE 15 MG/ML IJ SOLN
15.0000 mg | Freq: Four times a day (QID) | INTRAMUSCULAR | Status: DC | PRN
  Administered 2015-11-11 – 2015-11-12 (×3): 15 mg via INTRAVENOUS
  Filled 2015-11-08 (×3): qty 1

## 2015-11-08 NOTE — Progress Notes (Signed)
PROGRESS NOTE    Carrie [name]  ZOX:[id number] DOB: 11/11/1995 DOA: 11/06/2015 PCP: Lyda PeroneEES,JANET L, MD   Brief Narrative:  HPI on 11/06/2015 by Dr. Ronnald CollumXilin Niu [name] is a 20 y.o. female with medical history significant of asthma, depression, migraine headache and pseudotumor tumor cer tumor cerebri, fibromyalgia, who presents, wheezing and SOB. Patient reports that she has been having cough, wheezing and shortness of breath for several days, which has progressively getting worse. She was seen by her PCP 3 days ago, and given prescription for prednisone. She has been using inhaler and prednisone without any improvement. Patient coughs up brownish colored sputum, no chest pain, ffever or chills. Her shortness of breath has been progressively getting worse. She can still speak full sentences. Patient does not have vomiting, diarrhea, symptoms of UTI or unilateral weakness. She has mild nausea.   Assessment & Plan  Asthma exacerbation -Patient's cough, shortness of breath and wheezing are consistent with asthma exacerbation.  -CXR is negative for infiltration.  -Continue nebs, singular, solumedrol, azithromycin, mucinex -strep pneumococcal urine antigen negative -Respiratory viral panel negative -Blood cultures Show no growth to date  Pseudotumor cerebri  -patient was followed up by neurology PA, Filbert BertholdAlison [name]. Used to be on lasix and diamox, which she has taken for 1 year.  -f/u neurology PA, Alisson [name].  Migraine HA  -Continue ibuprofen PRN -Question whether headache is due to cough versus migraine versus pseudotumor cerebri -Continue to monitor closely  Depression -Stable, currently on no medications -Was on cymbalta  Hypokalemia -Resolved, continue to monitor and replace as needed  Normocytic anemia -Based and hemoglobin proximally 11, currently 10.7  -continue monitor CBC  DVT Prophylaxis  Lovenox  Code Status: Full  Family Communication: Mother at  bedside  Disposition Plan: Admitted. Possible discharge within 1-2 days  Consultants None  Procedures  None  Antibiotics   Anti-infectives    Start     Dose/Rate Route Frequency Ordered Stop   11/07/15 1000  azithromycin (ZITHROMAX) tablet 250 mg     250 mg Oral Daily 11/06/15 1456 11/11/15 0959   11/06/15 1600  azithromycin (ZITHROMAX) tablet 500 mg     500 mg Oral Daily 11/06/15 1456 11/06/15 1700      Subjective:   Carrie [name] seen and examined today.  Patient continues to complain of shortness of breath and cough, but feels mildly improved.  Also complains of headache. Denies chest pain, abdominal pain, nausea, vomiting.     Objective:   Filed Vitals:   11/07/15 2340 11/08/15 0015 11/08/15 0513 11/08/15 0736  BP:   115/70   Pulse:   101   Temp:   98 F (36.7 C)   TempSrc:   Oral   Resp:   18   Height:      Weight:      SpO2: 96% 94% 97% 95%    Intake/Output Summary (Last 24 hours) at 11/08/15 1210 Last data filed at 11/07/15 2000  Gross per 24 hour  Intake    240 ml  Output   1075 ml  Net   -835 ml   Filed Weights   11/06/15 1636  Weight: 135.2 kg (298 lb 1 oz)    Exam  General: Well developed, well nourished, NAD  HEENT: NCAT, mucous membranes moist.   Cardiovascular: S1 S2 auscultated, no murmurs, RRR  Respiratory: Expiratory wheezing, better air movement today  Abdomen: Soft, obese, nontender, nondistended, + bowel sounds  Extremities: warm dry without cyanosis clubbing or edema.  Neuro: AAOx3, nonfocal  Psych: Normal affect and demeanor, pleasant   Data Reviewed: I have personally reviewed following labs and imaging studies  CBC:  Recent Labs Lab 11/06/15 1105 11/06/15 1344 11/08/15 0451  WBC 10.9*  --  12.5*  NEUTROABS 7.2  --   --   HGB 10.7* 11.2* 10.7*  HCT 32.8* 33.0* 32.8*  MCV 82.2  --  83.0  PLT 311  --  332   Basic Metabolic Panel:  Recent Labs Lab 11/06/15 1105 11/06/15 1344 11/07/15 0458 11/08/15 0451   NA 138 141  --  136  K 3.3* 3.5  --  3.7  CL 104 102  --  102  CO2 28  --   --  25  GLUCOSE 111* 98  --  146*  BUN 11 10  --  14  CREATININE 0.70 0.70  --  0.69  CALCIUM 8.9  --   --  9.0  MG  --   --  2.1  --    GFR: Estimated Creatinine Clearance: 144.1 mL/min (by C-G formula based on Cr of 0.69). Liver Function Tests: No results for input(s): AST, ALT, ALKPHOS, BILITOT, PROT, ALBUMIN in the last 168 hours. No results for input(s): LIPASE, AMYLASE in the last 168 hours. No results for input(s): AMMONIA in the last 168 hours. Coagulation Profile: No results for input(s): INR, PROTIME in the last 168 hours. Cardiac Enzymes: No results for input(s): CKTOTAL, CKMB, CKMBINDEX, TROPONINI in the last 168 hours. BNP (last 3 results) No results for input(s): PROBNP in the last 8760 hours. HbA1C: No results for input(s): HGBA1C in the last 72 hours. CBG: No results for input(s): GLUCAP in the last 168 hours. Lipid Profile: No results for input(s): CHOL, HDL, LDLCALC, TRIG, CHOLHDL, LDLDIRECT in the last 72 hours. Thyroid Function Tests: No results for input(s): TSH, T4TOTAL, FREET4, T3FREE, THYROIDAB in the last 72 hours. Anemia Panel: No results for input(s): VITAMINB12, FOLATE, FERRITIN, TIBC, IRON, RETICCTPCT in the last 72 hours. Urine analysis:    Component Value Date/Time   COLORURINE YELLOW 09/16/2009 1708   APPEARANCEUR CLEAR 09/16/2009 1708   LABSPEC 1.025 09/16/2009 1708   PHURINE 6.5 09/16/2009 1708   GLUCOSEU NEGATIVE 09/16/2009 1708   HGBUR TRACE* 09/16/2009 1708   BILIRUBINUR NEGATIVE 09/16/2009 1708   KETONESUR NEGATIVE 09/16/2009 1708   PROTEINUR NEGATIVE 09/16/2009 1708   UROBILINOGEN 0.2 09/16/2009 1708   NITRITE NEGATIVE 09/16/2009 1708   LEUKOCYTESUR NEGATIVE 09/16/2009 1708   Sepsis Labs: (procalcitonin:4,lacticidven:4)  ) Recent Results (from the past 240 hour(s))  Culture, blood (routine x 2) Call MD if unable to obtain prior to  antibiotics being given     Status: None (Preliminary result)   Collection Time: 11/06/15  3:21 PM  Result Value Ref Range Status   Specimen Description BLOOD LEFT ANTECUBITAL  Final   Special Requests BOTTLES DRAWN AEROBIC AND ANAEROBIC 5 ML  Final   Culture   Final    NO GROWTH < 24 HOURS Performed at [facility]    Report Status PENDING  Incomplete  Culture, blood (routine x 2) Call MD if unable to obtain prior to antibiotics being given     Status: None (Preliminary result)   Collection Time: 11/06/15  3:26 PM  Result Value Ref Range Status   Specimen Description BLOOD RIGHT HAND  Final   Special Requests BOTTLES DRAWN AEROBIC AND ANAEROBIC 5 ML  Final   Culture   Final    NO GROWTH < 24  HOURS [facility]    Report Status PENDING  Incomplete  Respiratory Panel by PCR     Status: None   Collection Time: 11/06/15  5:42 PM  Result Value Ref Range Status   Adenovirus NOT DETECTED NOT DETECTED Final   Coronavirus 229E NOT DETECTED NOT DETECTED Final   Coronavirus HKU1 NOT DETECTED NOT DETECTED Final   Coronavirus NL63 NOT DETECTED NOT DETECTED Final   Coronavirus OC43 NOT DETECTED NOT DETECTED Final   Metapneumovirus NOT DETECTED NOT DETECTED Final   Rhinovirus / Enterovirus NOT DETECTED NOT DETECTED Final   Influenza A NOT DETECTED NOT DETECTED Final   Influenza A H1 NOT DETECTED NOT DETECTED Final   Influenza A H1 2009 NOT DETECTED NOT DETECTED Final   Influenza A H3 NOT DETECTED NOT DETECTED Final   Influenza B NOT DETECTED NOT DETECTED Final   Parainfluenza Virus 1 NOT DETECTED NOT DETECTED Final   Parainfluenza Virus 2 NOT DETECTED NOT DETECTED Final   Parainfluenza Virus 3 NOT DETECTED NOT DETECTED Final   Parainfluenza Virus 4 NOT DETECTED NOT DETECTED Final   Respiratory Syncytial Virus NOT DETECTED NOT DETECTED Final   Bordetella pertussis NOT DETECTED NOT DETECTED Final   Chlamydophila pneumoniae NOT DETECTED NOT DETECTED Final    Mycoplasma pneumoniae NOT DETECTED NOT DETECTED Final      Radiology Studies: No results found.   Scheduled Meds: . azithromycin  250 mg Oral Daily  . benzonatate  200 mg Oral TID  . dextromethorphan-guaiFENesin  1 tablet Oral BID  . enoxaparin (LOVENOX) injection  40 mg Subcutaneous Q24H  . fluticasone  1 spray Each Nare Daily  . ipratropium-albuterol  3 mL Nebulization QID  . loratadine  10 mg Oral Daily  . methylPREDNISolone (SOLU-MEDROL) injection  60 mg Intravenous TID  . montelukast  10 mg Oral QHS  . olopatadine  1 drop Both Eyes BID  . pantoprazole  40 mg Oral Daily  . polyethylene glycol  17 g Oral Daily   Continuous Infusions:    LOS: 1 day   Time Spent in minutes   30 minutes  Lowell Makara D.O. on 11/08/2015 at 12:10 PM  Between 7am to 7pm - Pager - [phone]  After 7pm go to [email or web address] - password [email or web address]  And look for the night coverage person covering for me after hours  [facility] Office  [phone]

## 2015-11-09 ENCOUNTER — Other Ambulatory Visit: Payer: Self-pay

## 2015-11-09 LAB — BASIC METABOLIC PANEL
ANION GAP: 8 (ref 5–15)
BUN: 13 mg/dL (ref 6–20)
CALCIUM: 9 mg/dL (ref 8.9–10.3)
CO2: 27 mmol/L (ref 22–32)
Chloride: 102 mmol/L (ref 101–111)
Creatinine, Ser: 0.53 mg/dL (ref 0.44–1.00)
GFR calc Af Amer: >60 mL/min (ref >60)
Glucose, Bld: 135 mg/dL — ABNORMAL HIGH (ref 65–99)
POTASSIUM: 4.2 mmol/L (ref 3.5–5.1)
SODIUM: 137 mmol/L (ref 135–145)

## 2015-11-09 LAB — CBC
HEMATOCRIT: 32.7 % — AB (ref 36.0–46.0)
Hemoglobin: 10.9 g/dL — ABNORMAL LOW (ref 12.0–15.0)
MCH: 27.2 pg (ref 26.0–34.0)
MCHC: 33.3 g/dL (ref 30.0–36.0)
MCV: 81.5 fL (ref 78.0–100.0)
PLATELETS: 319 10*3/uL (ref 150–400)
RBC: 4.01 MIL/uL (ref 3.87–5.11)
RDW: 15.9 % — AB (ref 11.5–15.5)
WBC: 12.9 10*3/uL — AB (ref 4.0–10.5)

## 2015-11-09 LAB — D-DIMER, QUANTITATIVE (NOT AT ARMC)

## 2015-11-09 LAB — TROPONIN I: Troponin I: <0.03 ng/mL (ref <0.031)

## 2015-11-09 MED ORDER — FLUTICASONE PROPIONATE 50 MCG/ACT NA SUSP
1.0000 | Freq: Two times a day (BID) | NASAL | Status: DC
  Administered 2015-11-09 – 2015-11-15 (×12): 1 via NASAL
  Filled 2015-11-09: qty 16

## 2015-11-09 MED ORDER — FUROSEMIDE 10 MG/ML IJ SOLN
20.0000 mg | Freq: Once | INTRAMUSCULAR | Status: AC
  Administered 2015-11-09: 20 mg via INTRAVENOUS
  Filled 2015-11-09: qty 2

## 2015-11-09 NOTE — Progress Notes (Addendum)
PROGRESS NOTE    Carrie [name]  ZOX:[id number] DOB: [birth year] DOA: 11/06/2015 PCP: Lyda Perone, MD   Brief Narrative:  HPI on 11/06/2015 by Dr. Ronnald Collum is a 20 y.o. female with medical history significant of asthma, depression, migraine headache and pseudotumor tumor cer tumor cerebri, fibromyalgia, who presents, wheezing and SOB. Patient reports that she has been having cough, wheezing and shortness of breath for several days, which has progressively getting worse. She was seen by her PCP 3 days ago, and given prescription for prednisone. She has been using inhaler and prednisone without any improvement. Patient coughs up brownish colored sputum, no chest pain, ffever or chills. Her shortness of breath has been progressively getting worse. She can still speak full sentences. Patient does not have vomiting, diarrhea, symptoms of UTI or unilateral weakness. She has mild nausea.   Interim history Admitted for asthma exacerbation. Improving, slowly.  Assessment & Plan  Asthma exacerbation -Patient's cough, shortness of breath and wheezing are consistent with asthma exacerbation.  -CXR is negative for infiltration x2  -Continue nebs, singular, solumedrol, azithromycin, mucinex -strep pneumococcal urine antigen negative -Respiratory viral panel negative -Blood cultures Show no growth to date -Spoke with pulmonology, recommended continuing current therapy.  If no improvement, consult 6/25. -Will check ddimer to rule out PE.  Pseudotumor cerebri  -patient was followed up by neurology PA, Filbert Berthold. Used to be on lasix and diamox, which she has taken for 1 year.  -f/u neurology PA, Alisson Eervin. -Will give one dose of lasix.  Migraine HA  -Continue ibuprofen PRN -Question whether headache is due to cough versus migraine versus pseudotumor cerebri -Continue to monitor closely  Depression -Stable, currently on no medications -Was on  cymbalta  Hypokalemia -Resolved, continue to monitor and replace as needed  Normocytic anemia -Based and hemoglobin proximally 11, currently 10.9  -continue monitor CBC  DVT Prophylaxis  Lovenox  Code Status: Full  Family Communication: Mother at bedside  Disposition Plan: Admitted. Possible discharge within 1-2 days  Consultants None  Procedures  None  Antibiotics   Anti-infectives    Start     Dose/Rate Route Frequency Ordered Stop   11/07/15 1000  azithromycin (ZITHROMAX) tablet 250 mg     250 mg Oral Daily 11/06/15 1456 11/11/15 0959   11/06/15 1600  azithromycin (ZITHROMAX) tablet 500 mg     500 mg Oral Daily 11/06/15 1456 11/06/15 1700      Subjective:   Carrie [name] seen and examined today.  Patient continues to complain of shortness of breath and cough, but feels mildly improved. Yesterday evening, patient had an "attack", but did well after receiving an hour long neb.  Continues to have headache. Denies chest pain, abdominal pain, nausea, vomiting.     Objective:   Filed Vitals:   11/08/15 2023 11/08/15 2204 11/09/15 0453 11/09/15 0812  BP:  115/64 137/90   Pulse:  105 60   Temp:  98.2 F (36.8 C) 98.1 F (36.7 C)   TempSrc:  Oral Oral   Resp:  18 18   Height:      Weight:      SpO2: 98% 96% 98% 98%    Intake/Output Summary (Last 24 hours) at 11/09/15 1201 Last data filed at 11/09/15 0900  Gross per 24 hour  Intake    960 ml  Output    400 ml  Net    560 ml   Filed Weights   11/06/15 1636  Weight: 135.2 kg (  298 lb 1 oz)    Exam  General: Well developed, well nourished, no distress  HEENT: NCAT, mucous membranes moist.   Cardiovascular: S1 S2 auscultated, no murmurs, RRR  Respiratory: Expiratory wheezing L>R, better air movement today, occ cough  Abdomen: Soft, obese, nontender, nondistended, + bowel sounds  Extremities: warm dry without cyanosis clubbing or edema.  Neuro: AAOx3, nonfocal  Psych: Normal affect and demeanor,  pleasant   Data Reviewed: I have personally reviewed following labs and imaging studies  CBC:  Recent Labs Lab 11/06/15 1105 11/06/15 1344 11/08/15 0451 11/09/15 0434  WBC 10.9*  --  12.5* 12.9*  NEUTROABS 7.2  --   --   --   HGB 10.7* 11.2* 10.7* 10.9*  HCT 32.8* 33.0* 32.8* 32.7*  MCV 82.2  --  83.0 81.5  PLT 311  --  332 319   Basic Metabolic Panel:  Recent Labs Lab 11/06/15 1105 11/06/15 1344 11/07/15 0458 11/08/15 0451 11/09/15 0434  NA 138 141  --  136 137  K 3.3* 3.5  --  3.7 4.2  CL 104 102  --  102 102  CO2 28  --   --  25 27  GLUCOSE 111* 98  --  146* 135*  BUN 11 10  --  14 13  CREATININE 0.70 0.70  --  0.69 0.53  CALCIUM 8.9  --   --  9.0 9.0  MG  --   --  2.1  --   --    GFR: Estimated Creatinine Clearance: 144.1 mL/min (by C-G formula based on Cr of 0.53). Liver Function Tests: No results for input(s): AST, ALT, ALKPHOS, BILITOT, PROT, ALBUMIN in the last 168 hours. No results for input(s): LIPASE, AMYLASE in the last 168 hours. No results for input(s): AMMONIA in the last 168 hours. Coagulation Profile: No results for input(s): INR, PROTIME in the last 168 hours. Cardiac Enzymes: No results for input(s): CKTOTAL, CKMB, CKMBINDEX, TROPONINI in the last 168 hours. BNP (last 3 results) No results for input(s): PROBNP in the last 8760 hours. HbA1C: No results for input(s): HGBA1C in the last 72 hours. CBG: No results for input(s): GLUCAP in the last 168 hours. Lipid Profile: No results for input(s): CHOL, HDL, LDLCALC, TRIG, CHOLHDL, LDLDIRECT in the last 72 hours. Thyroid Function Tests: No results for input(s): TSH, T4TOTAL, FREET4, T3FREE, THYROIDAB in the last 72 hours. Anemia Panel: No results for input(s): VITAMINB12, FOLATE, FERRITIN, TIBC, IRON, RETICCTPCT in the last 72 hours. Urine analysis:    Component Value Date/Time   COLORURINE YELLOW 09/16/2009 1708   APPEARANCEUR CLEAR 09/16/2009 1708   LABSPEC 1.025 09/16/2009 1708    PHURINE 6.5 09/16/2009 1708   GLUCOSEU NEGATIVE 09/16/2009 1708   HGBUR TRACE* 09/16/2009 1708   BILIRUBINUR NEGATIVE 09/16/2009 1708   KETONESUR NEGATIVE 09/16/2009 1708   PROTEINUR NEGATIVE 09/16/2009 1708   UROBILINOGEN 0.2 09/16/2009 1708   NITRITE NEGATIVE 09/16/2009 1708   LEUKOCYTESUR NEGATIVE 09/16/2009 1708   Sepsis Labs: (procalcitonin:4,lacticidven:4)  ) Recent Results (from the past 240 hour(s))  Culture, blood (routine x 2) Call MD if unable to obtain prior to antibiotics being given     Status: None (Preliminary result)   Collection Time: 11/06/15  3:21 PM  Result Value Ref Range Status   Specimen Description BLOOD LEFT ANTECUBITAL  Final   Special Requests BOTTLES DRAWN AEROBIC AND ANAEROBIC 5 ML  Final   Culture   Final    NO GROWTH 2 DAYS Performed at [facility]    Report Status PENDING  Incomplete  Culture, blood (routine x 2) Call MD if unable to obtain prior to antibiotics being given     Status: None (Preliminary result)   Collection Time: 11/06/15  3:26 PM  Result Value Ref Range Status   Specimen Description BLOOD RIGHT HAND  Final   Special Requests BOTTLES DRAWN AEROBIC AND ANAEROBIC 5 ML  Final   Culture   Final    NO GROWTH 2 DAYS Performed [facility]    Report Status PENDING  Incomplete  Respiratory Panel by PCR     Status: None   Collection Time: 11/06/15  5:42 PM  Result Value Ref Range Status   Adenovirus NOT DETECTED NOT DETECTED Final   Coronavirus 229E NOT DETECTED NOT DETECTED Final   Coronavirus HKU1 NOT DETECTED NOT DETECTED Final   Coronavirus NL63 NOT DETECTED NOT DETECTED Final   Coronavirus OC43 NOT DETECTED NOT DETECTED Final   Metapneumovirus NOT DETECTED NOT DETECTED Final   Rhinovirus / Enterovirus NOT DETECTED NOT DETECTED Final   Influenza A NOT DETECTED NOT DETECTED Final   Influenza A H1 NOT DETECTED NOT DETECTED Final   Influenza A H1 2009 NOT DETECTED NOT DETECTED Final   Influenza A H3  NOT DETECTED NOT DETECTED Final   Influenza B NOT DETECTED NOT DETECTED Final   Parainfluenza Virus 1 NOT DETECTED NOT DETECTED Final   Parainfluenza Virus 2 NOT DETECTED NOT DETECTED Final   Parainfluenza Virus 3 NOT DETECTED NOT DETECTED Final   Parainfluenza Virus 4 NOT DETECTED NOT DETECTED Final   Respiratory Syncytial Virus NOT DETECTED NOT DETECTED Final   Bordetella pertussis NOT DETECTED NOT DETECTED Final   Chlamydophila pneumoniae NOT DETECTED NOT DETECTED Final   Mycoplasma pneumoniae NOT DETECTED NOT DETECTED Final  Culture, sputum-assessment     Status: None   Collection Time: 11/08/15  3:16 PM  Result Value Ref Range Status   Specimen Description SPUTUM  Final   Special Requests NONE  Final   Sputum evaluation   Final    MICROSCOPIC FINDINGS SUGGEST THAT THIS SPECIMEN IS NOT REPRESENTATIVE OF LOWER RESPIRATORY SECRETIONS. PLEASE RECOLLECT. Gram Stain Report Called to,Read Back By and Verified With: Debroah LoopKATIE DUNKELBERGER RN 6.23.17 @ 1605 BY RICEJ    Report Status 11/08/2015 FINAL  Final      Radiology Studies: Dg Chest 2 View  11/08/2015  CLINICAL DATA:  Worsening short breath, dry cough EXAM: CHEST  2 VIEW COMPARISON:  None. FINDINGS: Normal mediastinum and cardiac silhouette. Normal pulmonary vasculature. No evidence of effusion, infiltrate, or pneumothorax. No acute bony abnormality. IMPRESSION: No acute cardiopulmonary process. Electronically Signed   By: Genevive BiStewart  Edmunds M.D.   On: 11/08/2015 21:17     Scheduled Meds: . azithromycin  250 mg Oral Daily  . benzonatate  200 mg Oral TID  . dextromethorphan-guaiFENesin  1 tablet Oral BID  . enoxaparin (LOVENOX) injection  40 mg Subcutaneous Q24H  . fluticasone  1 spray Each Nare Daily  . ipratropium-albuterol  3 mL Nebulization QID  . loratadine  10 mg Oral Daily  . methylPREDNISolone (SOLU-MEDROL) injection  60 mg Intravenous TID  . montelukast  10 mg Oral QHS  . olopatadine  1 drop Both Eyes BID  . pantoprazole   40 mg Oral Daily  . polyethylene glycol  17 g Oral Daily   Continuous Infusions: . albuterol 5 mg/hr (11/08/15 2022)     LOS: 2 days   Time Spent in minutes  30 minutes  Savana Spina D.O. on 11/09/2015 at 12:01 PM  Between 7am to 7pm - Pager - [phone]  After 7pm go to [email or web address] - password [email or web address]  And look for the night coverage person covering for me after hours  [facility] Office  [phone]

## 2015-11-09 NOTE — Progress Notes (Signed)
Patient c/o sharp chest pain radiating to L arm.  MD made aware.  EKG done, troponin ordered.  Will continue to monitor closely.

## 2015-11-10 ENCOUNTER — Inpatient Hospital Stay (HOSPITAL_COMMUNITY): Payer: Medicaid Other

## 2015-11-10 DIAGNOSIS — G43909 Migraine, unspecified, not intractable, without status migrainosus: Secondary | ICD-10-CM

## 2015-11-10 DIAGNOSIS — F329 Major depressive disorder, single episode, unspecified: Secondary | ICD-10-CM

## 2015-11-10 DIAGNOSIS — R000 Tachycardia, unspecified: Secondary | ICD-10-CM

## 2015-11-10 LAB — BASIC METABOLIC PANEL
Anion gap: 8 (ref 5–15)
BUN: 16 mg/dL (ref 6–20)
CALCIUM: 9 mg/dL (ref 8.9–10.3)
CHLORIDE: 100 mmol/L — AB (ref 101–111)
CO2: 29 mmol/L (ref 22–32)
CREATININE: 0.67 mg/dL (ref 0.44–1.00)
GFR calc Af Amer: >60 mL/min (ref >60)
GFR calc non Af Amer: >60 mL/min (ref >60)
Glucose, Bld: 126 mg/dL — ABNORMAL HIGH (ref 65–99)
Potassium: 4.2 mmol/L (ref 3.5–5.1)
Sodium: 137 mmol/L (ref 135–145)

## 2015-11-10 LAB — CBC
HEMATOCRIT: 34.6 % — AB (ref 36.0–46.0)
HEMOGLOBIN: 11.2 g/dL — AB (ref 12.0–15.0)
MCH: 26.7 pg (ref 26.0–34.0)
MCHC: 32.4 g/dL (ref 30.0–36.0)
MCV: 82.6 fL (ref 78.0–100.0)
Platelets: 342 10*3/uL (ref 150–400)
RBC: 4.19 MIL/uL (ref 3.87–5.11)
RDW: 15.8 % — AB (ref 11.5–15.5)
WBC: 15.6 10*3/uL — ABNORMAL HIGH (ref 4.0–10.5)

## 2015-11-10 MED ORDER — ALBUTEROL SULFATE (2.5 MG/3ML) 0.083% IN NEBU
2.5000 mg | INHALATION_SOLUTION | RESPIRATORY_TRACT | Status: DC
  Administered 2015-11-10 – 2015-11-15 (×31): 2.5 mg via RESPIRATORY_TRACT
  Filled 2015-11-10 (×30): qty 3

## 2015-11-10 MED ORDER — IOPAMIDOL (ISOVUE-370) INJECTION 76%
100.0000 mL | Freq: Once | INTRAVENOUS | Status: AC | PRN
  Administered 2015-11-10: 100 mL via INTRAVENOUS

## 2015-11-10 MED ORDER — ZOLPIDEM TARTRATE 5 MG PO TABS
5.0000 mg | ORAL_TABLET | Freq: Once | ORAL | Status: AC
  Administered 2015-11-10: 5 mg via ORAL
  Filled 2015-11-10: qty 1

## 2015-11-10 MED ORDER — LORAZEPAM 0.5 MG PO TABS
0.5000 mg | ORAL_TABLET | Freq: Once | ORAL | Status: AC | PRN
  Administered 2015-11-10: 0.5 mg via ORAL
  Filled 2015-11-10: qty 1

## 2015-11-10 MED ORDER — SODIUM CHLORIDE 0.9 % IV SOLN
INTRAVENOUS | Status: AC
  Administered 2015-11-10 (×2): via INTRAVENOUS

## 2015-11-10 NOTE — Progress Notes (Signed)
Reassessed patient.  Continues to have tachycardia with activity, SOB, pleuritic chest pain.  CXRs reviewed and normal, no cardiomegaly noted.  Her continues course with above symptoms are concerning for PE (D dimer normal), atypical infection not covered by azithromycin, undiagnosed cardiac disease and anxiety.  Wells score is 1 point (low risk) Geneva score is 9 points (moderate risk).  Based on brief review of literature, there are cases of normal D dimer with imaging proven PE.  Given her age and unresolving symptoms, will proceed with CTA of the chest.  Addressed risks of the study with patient and mother and they agreed to have the study done.    Debe CoderMULLEN, Eula Jaster, MD

## 2015-11-10 NOTE — Progress Notes (Signed)
Pt expressed concerns regarding worsening breathing at night.  Respiratory protocol assessment was done indicating qid treatments, however pt has been given several prn nebs per her request.  Pt is able to speak in full sentences and vitals have been wnl.  Pt has wheezes throughout lung fields with a productive/congested cough.  Nebulizer treatments changed to albuterol 2.5mg  q4 and q2prn for sob/wheeze.  Pt is in agreement with this schedule.  RT will continue to monitor and assess pt.

## 2015-11-10 NOTE — Progress Notes (Signed)
Rt gave pt flutter valve. Pt knows and understands how to use. 

## 2015-11-10 NOTE — Progress Notes (Signed)
PROGRESS NOTE    Carrie MuskratShaunja D [name]  ZOX:[id number] DOB: 07/13/1995 DOA: 11/06/2015 PCP: Lyda PeroneEES,JANET L, MD    Brief Narrative:  HPI on 11/06/2015 by Dr. Ronnald CollumXilin [name] Carrie [name] is a 20 y.o. female with medical history significant of asthma, depression, migraine headache and pseudotumor tumor cer tumor cerebri, fibromyalgia, who presents, wheezing and SOB. Patient reports that she has been having cough, wheezing and shortness of breath for several days, which has progressively getting worse. She was seen by her PCP 3 days ago, and given prescription for prednisone. She has been using inhaler and prednisone without any improvement. Patient coughs up brownish colored sputum, no chest pain, ffever or chills. Her shortness of breath has been progressively getting worse. She can still speak full sentences. Patient does not have vomiting, diarrhea, symptoms of UTI or unilateral weakness. She has mild nausea.   Interim history Admitted for asthma exacerbation. Improving, slowly.  FAmily and patient very concerned with course.  Reports never being admitted for asthma.    Assessment & Plan:   Asthma exacerbation - Patient maintaining saturation on room air, she is speaking in full sentences and comfortable when lying still.  She is having increased SOB and tachycardia with movement around the room and with increased stress.  I think this is more likely related to anxiety based on what she is telling me (feelings of impending doom, racing heart, etc).   - She will now be on albuterol more often (will likely increase her racing heart symptoms) and has a flutter valve - She is having some pain with coughing in the left chest - evaluated for ACS and found to be negative (she is young) - Continue steroids, nebulizers, singulair, azithromycin - I do not see any need at this time for magnesium or other treatments for severe exacerbation - Discussed plan at length with family and patient - PCCM consult  tomorrow if no better - D dimer was normal on admission.  - CXR negative X 2 for pneumonia  Pseudotumor cerebri - No longer on therapy - No mention of headache today - Lasix given - If worsening or changing headache, consider further testing    Migraine - Continue ibuprofen prn  Depression - Stable, no acute issue    Hypokalemia - Resolved  DVT Prophylaxis Lovenox  Code Status: Full  Family Communication: Mother at bedside  Disposition Plan: Admitted. Possible discharge within 1-2 days  Consultants:   None  Procedures:   None  Antimicrobials: (specify start and planned stop date. Auto populated tables are space occupying and do not give end dates)  Azithromycin    Subjective: Patient states that she is not doing as well as she hoped.  She is speaking in full sentences, pulse-ox at goal without oxygen, but she has been having increased coughing and tachycardia with walking which makes her feel like she is choking.  This is new for her.  She has never been admitted for asthma before.  She also has increased stress and an episode of chest pain when her father was in the room (relationship is strained).  Respiratory has evaluated her and has increased her albuterol timing and also stopped ipratroprium as she notes it was not helping her.  Mother is also at bedside.   Objective: Filed Vitals:   11/10/15 0122 11/10/15 0539 11/10/15 0631 11/10/15 0742  BP:   129/81   Pulse: 68  98   Temp:   98.4 F (36.9 C)   TempSrc:  Oral   Resp: 20  18   Height:      Weight:      SpO2:  98% 94% 95%    Intake/Output Summary (Last 24 hours) at 11/10/15 0949 Last data filed at 11/10/15 0557  Gross per 24 hour  Intake    240 ml  Output   2225 ml  Net  -1985 ml   Filed Weights   11/06/15 1636  Weight: 298 lb 1 oz (135.2 kg)    Examination:  General exam: Appears calm and comfortable when speaking.    Respiratory system: Course rhonchi throughout, but air movement is  apparent in lower lung fields, inspiratory wheezing in mid lung fields.  Deep coughing with deep breathing.   Cardiovascular system: S1 & S2 heard, RR, NR.  Gastrointestinal system: Abdomen is nondistended, soft and nontender.  Central nervous system: Alert and oriented. No focal neurological deficits. Skin: No rashes, lesions or ulcers noted Psychiatry: Judgement and insight appear normal. Mood & affect appropriate.     Data Reviewed:   CBC:  Recent Labs Lab 11/06/15 1105 11/06/15 1344 11/08/15 0451 11/09/15 0434 11/10/15 0521  WBC 10.9*  --  12.5* 12.9* 15.6*  NEUTROABS 7.2  --   --   --   --   HGB 10.7* 11.2* 10.7* 10.9* 11.2*  HCT 32.8* 33.0* 32.8* 32.7* 34.6*  MCV 82.2  --  83.0 81.5 82.6  PLT 311  --  332 319 342   Basic Metabolic Panel:  Recent Labs Lab 11/06/15 1105 11/06/15 1344 11/07/15 0458 11/08/15 0451 11/09/15 0434 11/10/15 0521  NA 138 141  --  136 137 137  K 3.3* 3.5  --  3.7 4.2 4.2  CL 104 102  --  102 102 100*  CO2 28  --   --  25 27 29   GLUCOSE 111* 98  --  146* 135* 126*  BUN 11 10  --  14 13 16   CREATININE 0.70 0.70  --  0.69 0.53 0.67  CALCIUM 8.9  --   --  9.0 9.0 9.0  MG  --   --  2.1  --   --   --    Cardiac Enzymes:  Recent Labs Lab 11/09/15 1845  TROPONINI <0.03     Recent Results (from the past 240 hour(s))  Culture, blood (routine x 2) Call MD if unable to obtain prior to antibiotics being given     Status: None (Preliminary result)   Collection Time: 11/06/15  3:21 PM  Result Value Ref Range Status   Specimen Description BLOOD LEFT ANTECUBITAL  Final   Special Requests BOTTLES DRAWN AEROBIC AND ANAEROBIC 5 ML  Final   Culture   Final    NO GROWTH 3 DAYS [facility]    Report Status PENDING  Incomplete  Culture, blood (routine x 2) Call MD if unable to obtain prior to antibiotics being given     Status: None (Preliminary result)   Collection Time: 11/06/15  3:26 PM  Result Value Ref Range Status    Specimen Description BLOOD RIGHT HAND  Final   Special Requests BOTTLES DRAWN AEROBIC AND ANAEROBIC 5 ML  Final   Culture   Final    NO GROWTH 3 DAYS [facility]    Report Status PENDING  Incomplete  Respiratory Panel by PCR     Status: None   Collection Time: 11/06/15  5:42 PM  Result Value Ref Range Status  Adenovirus NOT DETECTED NOT DETECTED Final   Coronavirus 229E NOT DETECTED NOT DETECTED Final   Coronavirus HKU1 NOT DETECTED NOT DETECTED Final   Coronavirus NL63 NOT DETECTED NOT DETECTED Final   Coronavirus OC43 NOT DETECTED NOT DETECTED Final   Metapneumovirus NOT DETECTED NOT DETECTED Final   Rhinovirus / Enterovirus NOT DETECTED NOT DETECTED Final   Influenza A NOT DETECTED NOT DETECTED Final   Influenza A H1 NOT DETECTED NOT DETECTED Final   Influenza A H1 2009 NOT DETECTED NOT DETECTED Final   Influenza A H3 NOT DETECTED NOT DETECTED Final   Influenza B NOT DETECTED NOT DETECTED Final   Parainfluenza Virus 1 NOT DETECTED NOT DETECTED Final   Parainfluenza Virus 2 NOT DETECTED NOT DETECTED Final   Parainfluenza Virus 3 NOT DETECTED NOT DETECTED Final   Parainfluenza Virus 4 NOT DETECTED NOT DETECTED Final   Respiratory Syncytial Virus NOT DETECTED NOT DETECTED Final   Bordetella pertussis NOT DETECTED NOT DETECTED Final   Chlamydophila pneumoniae NOT DETECTED NOT DETECTED Final   Mycoplasma pneumoniae NOT DETECTED NOT DETECTED Final  Culture, sputum-assessment     Status: None   Collection Time: 11/08/15  3:16 PM  Result Value Ref Range Status   Specimen Description SPUTUM  Final   Special Requests NONE  Final   Sputum evaluation   Final    MICROSCOPIC FINDINGS SUGGEST THAT THIS SPECIMEN IS NOT REPRESENTATIVE OF LOWER RESPIRATORY SECRETIONS. PLEASE RECOLLECT. Gram Stain Report Called to,Read Back By and Verified With: Debroah Loop RN 6.23.17 @ 1605 BY RICEJ    Report Status 11/08/2015 FINAL  Final         Radiology Studies: Dg  Chest 2 View  11/08/2015  CLINICAL DATA:  Worsening short breath, dry cough EXAM: CHEST  2 VIEW COMPARISON:  None. FINDINGS: Normal mediastinum and cardiac silhouette. Normal pulmonary vasculature. No evidence of effusion, infiltrate, or pneumothorax. No acute bony abnormality. IMPRESSION: No acute cardiopulmonary process. Electronically Signed   By: Genevive Bi M.D.   On: 11/08/2015 21:17     Scheduled Meds: . albuterol  2.5 mg Nebulization Q4H  . benzonatate  200 mg Oral TID  . dextromethorphan-guaiFENesin  1 tablet Oral BID  . enoxaparin (LOVENOX) injection  40 mg Subcutaneous Q24H  . fluticasone  1 spray Each Nare BID  . loratadine  10 mg Oral Daily  . methylPREDNISolone (SOLU-MEDROL) injection  60 mg Intravenous TID  . montelukast  10 mg Oral QHS  . olopatadine  1 drop Both Eyes BID  . pantoprazole  40 mg Oral Daily  . polyethylene glycol  17 g Oral Daily   Continuous Infusions: . albuterol 5 mg/hr (11/08/15 2022)     LOS: 3 days    Time spent: 25 minutes    Debe Coder, MD [facility] Pager [phone]  If 7PM-7AM, please contact night-coverage [email or web address] Password [facility] 11/10/2015, 9:49 AM

## 2015-11-11 DIAGNOSIS — J189 Pneumonia, unspecified organism: Principal | ICD-10-CM

## 2015-11-11 DIAGNOSIS — J181 Lobar pneumonia, unspecified organism: Secondary | ICD-10-CM

## 2015-11-11 DIAGNOSIS — R0602 Shortness of breath: Secondary | ICD-10-CM | POA: Insufficient documentation

## 2015-11-11 LAB — BASIC METABOLIC PANEL
Anion gap: 7 (ref 5–15)
BUN: 18 mg/dL (ref 6–20)
CHLORIDE: 100 mmol/L — AB (ref 101–111)
CO2: 28 mmol/L (ref 22–32)
Calcium: 8.7 mg/dL — ABNORMAL LOW (ref 8.9–10.3)
Creatinine, Ser: 0.6 mg/dL (ref 0.44–1.00)
GFR calc Af Amer: >60 mL/min (ref >60)
GFR calc non Af Amer: >60 mL/min (ref >60)
Glucose, Bld: 125 mg/dL — ABNORMAL HIGH (ref 65–99)
POTASSIUM: 4.3 mmol/L (ref 3.5–5.1)
SODIUM: 135 mmol/L (ref 135–145)

## 2015-11-11 LAB — CBC
HCT: 33.8 % — ABNORMAL LOW (ref 36.0–46.0)
Hemoglobin: 11 g/dL — ABNORMAL LOW (ref 12.0–15.0)
MCH: 26.8 pg (ref 26.0–34.0)
MCHC: 32.5 g/dL (ref 30.0–36.0)
MCV: 82.2 fL (ref 78.0–100.0)
Platelets: 342 10*3/uL (ref 150–400)
RBC: 4.11 MIL/uL (ref 3.87–5.11)
RDW: 15.7 % — AB (ref 11.5–15.5)
WBC: 14.8 10*3/uL — AB (ref 4.0–10.5)

## 2015-11-11 LAB — CULTURE, BLOOD (ROUTINE X 2)
CULTURE: NO GROWTH
CULTURE: NO GROWTH

## 2015-11-11 MED ORDER — DEXTROSE 5 % IV SOLN
2.0000 g | INTRAVENOUS | Status: DC
  Administered 2015-11-11 – 2015-11-12 (×2): 2 g via INTRAVENOUS
  Filled 2015-11-11 (×2): qty 2

## 2015-11-11 NOTE — Progress Notes (Signed)
Pharmacy Antibiotic Note  Carrie [name] is a 20 y.o. female admitted on 11/06/2015 with pneumonia.  Pharmacy has been consulted for ceftriaxone dosing.  Plan:  Ceftriaxone 2gm IV q24h (larger dose for body habitus)  No renal adjustment needed for ceftriaxone, pharmacy to sign-off  Height: 5' (152.4 cm) Weight: 298 lb 1 oz (135.2 kg) IBW/kg (Calculated) : 45.5  Temp (24hrs), Avg:98.4 F (36.9 C), Min:98.3 F (36.8 C), Max:98.4 F (36.9 C)   Recent Labs Lab 11/06/15 1105 11/06/15 1344 11/08/15 0451 11/09/15 0434 11/10/15 0521 11/11/15 0436  WBC 10.9*  --  12.5* 12.9* 15.6* 14.8*  CREATININE 0.70 0.70 0.69 0.53 0.67 0.60    Estimated Creatinine Clearance: 144.1 mL/min (by C-G formula based on Cr of 0.6).    Allergies  Allergen Reactions  . Kiwi Extract Itching    Tongue itching   . Pineapple Itching    Tongue itching   . Other     PT IS A JEHOVAH WITNESS. NO BLOOD PRODUCTS  . Percocet [Oxycodone-Acetaminophen] Other (See Comments)    vomitting and hard to arouse    Antimicrobials this admission: 6/22 >> azithromycin >> 6/25 6/26 >> ceftriaxone  Dose adjustments this admission:  Microbiology results: 6/21 BCx: NGTD 6/21 Resp panel: neg 6/23 Sputum: poor sample   Thank you for allowing pharmacy to be a part of this patient's care.  Juliette Alcideustin Rosenda Geffrard, PharmD, BCPS.   Pager: [phone] 11/11/2015 7:44 AM

## 2015-11-11 NOTE — Progress Notes (Addendum)
PROGRESS NOTE    Carrie [name]  ZOX:[id number] DOB: 03/22/1996 DOA: 11/06/2015 PCP: Lyda PeroneEES,JANET L, MD    Brief Narrative:  HPI on 11/06/2015 by Dr. Ronnald CollumXilin Niu [name] is a 20 y.o. female with medical history significant of asthma, depression, migraine headache and pseudotumor tumor cer tumor cerebri, fibromyalgia, who presents, wheezing and SOB. Patient reports that she has been having cough, wheezing and shortness of breath for several days, which has progressively getting worse. She was seen by her PCP 3 days ago, and given prescription for prednisone. She has been using inhaler and prednisone without any improvement. Patient coughs up brownish colored sputum, no chest pain, ffever or chills. Her shortness of breath has been progressively getting worse. She can still speak full sentences. Patient does not have vomiting, diarrhea, symptoms of UTI or unilateral weakness. She has mild nausea.   Interim history Admitted for asthma exacerbation. Improving, slowly.  Family and patient very concerned with course.  Reports never being admitted for asthma.  CT scan done on 6/25 showed a left lower lobe infiltrate consistent with pneumonia.    Assessment & Plan:   Asthma exacerbation with LLL pneumonia - CT scan of the chest showed no PE, but an infiltrate in the left lower lobe.  Interestingly, on exam today, she does have some rhonchi on that side now that wheezing is all but gone.  She also has ongoing pleuritic chest pain which has been mostly restricted to that side.     - Continue albuterol and flutter valve - Add Rocephin to Azithromycin - Continue steroids, singulair - WBC down to 14.8 - Renal function stable.  - Respiratory virus panel negative.  - HIV negative  Pseudotumor cerebri - No longer on therapy - No mention of headache today - If worsening or changing headache, consider further testing or resumption of therapy     Migraine - Continue ibuprofen  prn  Depression - Stable, no acute issue    Hypokalemia - Resolved  DVT Prophylaxis Lovenox  Code Status: Full  Family Communication: Mother at bedside  Disposition Plan: Admitted. Possible discharge within 1-2 days  Consultants:   None  Procedures:   None  Antimicrobials:  Azithromycin - received 5 days total  Ceftriaxone 6/26 - -> current   Subjective: Patients SOB is improved.  Cough and left sided pleuritic chest pain is the same.  She reports now that she was recently exposed to her sister in law who had bilateral walking pneumonia and required an ICU stay.  She was in the hospital a month ago and helped care for her sister in law on discharge.   Objective: Filed Vitals:   11/10/15 2354 11/11/15 0500 11/11/15 0611 11/11/15 0802  BP:   142/98   Pulse:   74   Temp:   98.4 F (36.9 C)   TempSrc:   Oral   Resp:   20   Height:      Weight:      SpO2: 94% 97% 96% 96%    Intake/Output Summary (Last 24 hours) at 11/11/15 1404 Last data filed at 11/11/15 1036  Gross per 24 hour  Intake      0 ml  Output   1550 ml  Net  -1550 ml   Filed Weights   11/06/15 1636  Weight: 298 lb 1 oz (135.2 kg)    Examination:  General exam: Appears calm and comfortable, no SOB at rest HENT: NCAT, MMM Respiratory system: Course breath sounds on the  left, rhonchi in left lower lobe, wheezing resolved, coughing with deep breathing.     Cardiovascular system: S1 & S2 heard, RR, NR.  Gastrointestinal system: Abdomen is nondistended, soft and nontender.  Central nervous system: Alert and oriented. No focal neurological deficits. Skin: No rashes, lesions or ulcers on extremities Psychiatry: Judgement and insight appear normal. Mood & affect appropriate.     Data Reviewed:   CBC:  Recent Labs Lab 11/06/15 1105 11/06/15 1344 11/08/15 0451 11/09/15 0434 11/10/15 0521 11/11/15 0436  WBC 10.9*  --  12.5* 12.9* 15.6* 14.8*  NEUTROABS 7.2  --   --   --   --   --    HGB 10.7* 11.2* 10.7* 10.9* 11.2* 11.0*  HCT 32.8* 33.0* 32.8* 32.7* 34.6* 33.8*  MCV 82.2  --  83.0 81.5 82.6 82.2  PLT 311  --  332 319 342 342   Basic Metabolic Panel:  Recent Labs Lab 11/06/15 1105 11/06/15 1344 11/07/15 0458 11/08/15 0451 11/09/15 0434 11/10/15 0521 11/11/15 0436  NA 138 141  --  136 137 137 135  K 3.3* 3.5  --  3.7 4.2 4.2 4.3  CL 104 102  --  102 102 100* 100*  CO2 28  --   --  25 27 29 28   GLUCOSE 111* 98  --  146* 135* 126* 125*  BUN 11 10  --  14 13 16 18   CREATININE 0.70 0.70  --  0.69 0.53 0.67 0.60  CALCIUM 8.9  --   --  9.0 9.0 9.0 8.7*  MG  --   --  2.1  --   --   --   --    Cardiac Enzymes:  Recent Labs Lab 11/09/15 1845  TROPONINI <0.03     Recent Results (from the past 240 hour(s))  Culture, blood (routine x 2) Call MD if unable to obtain prior to antibiotics being given     Status: None (Preliminary result)   Collection Time: 11/06/15  3:21 PM  Result Value Ref Range Status   Specimen Description BLOOD LEFT ANTECUBITAL  Final   Special Requests BOTTLES DRAWN AEROBIC AND ANAEROBIC 5 ML  Final   Culture   Final    NO GROWTH 4 DAYS Performed at [facility]    Report Status PENDING  Incomplete  Culture, blood (routine x 2) Call MD if unable to obtain prior to antibiotics being given     Status: None (Preliminary result)   Collection Time: 11/06/15  3:26 PM  Result Value Ref Range Status   Specimen Description BLOOD RIGHT HAND  Final   Special Requests BOTTLES DRAWN AEROBIC AND ANAEROBIC 5 ML  Final   Culture   Final    NO GROWTH 4 DAYS Performed at [facility]    Report Status PENDING  Incomplete  Respiratory Panel by PCR     Status: None   Collection Time: 11/06/15  5:42 PM  Result Value Ref Range Status   Adenovirus NOT DETECTED NOT DETECTED Final   Coronavirus 229E NOT DETECTED NOT DETECTED Final   Coronavirus HKU1 NOT DETECTED NOT DETECTED Final   Coronavirus NL63 NOT DETECTED NOT DETECTED Final    Coronavirus OC43 NOT DETECTED NOT DETECTED Final   Metapneumovirus NOT DETECTED NOT DETECTED Final   Rhinovirus / Enterovirus NOT DETECTED NOT DETECTED Final   Influenza A NOT DETECTED NOT DETECTED Final   Influenza A H1 NOT DETECTED NOT DETECTED Final   Influenza A H1 2009 NOT  DETECTED NOT DETECTED Final   Influenza A H3 NOT DETECTED NOT DETECTED Final   Influenza B NOT DETECTED NOT DETECTED Final   Parainfluenza Virus 1 NOT DETECTED NOT DETECTED Final   Parainfluenza Virus 2 NOT DETECTED NOT DETECTED Final   Parainfluenza Virus 3 NOT DETECTED NOT DETECTED Final   Parainfluenza Virus 4 NOT DETECTED NOT DETECTED Final   Respiratory Syncytial Virus NOT DETECTED NOT DETECTED Final   Bordetella pertussis NOT DETECTED NOT DETECTED Final   Chlamydophila pneumoniae NOT DETECTED NOT DETECTED Final   Mycoplasma pneumoniae NOT DETECTED NOT DETECTED Final  Culture, sputum-assessment     Status: None   Collection Time: 11/08/15  3:16 PM  Result Value Ref Range Status   Specimen Description SPUTUM  Final   Special Requests NONE  Final   Sputum evaluation   Final    MICROSCOPIC FINDINGS SUGGEST THAT THIS SPECIMEN IS NOT REPRESENTATIVE OF LOWER RESPIRATORY SECRETIONS. PLEASE RECOLLECT. Gram Stain Report Called to,Read Back By and Verified With: Debroah Loop RN 6.23.17 @ 1605 BY RICEJ    Report Status 11/08/2015 FINAL  Final         Radiology Studies: Ct Angio Chest Pe W Or Wo Contrast  11/10/2015  CLINICAL DATA:  Shortness of breath, tachycardia, chest pain EXAM: CT ANGIOGRAPHY CHEST WITH CONTRAST TECHNIQUE: Multidetector CT imaging of the chest was performed using the standard protocol during bolus administration of intravenous contrast. Multiplanar CT image reconstructions and MIPs were obtained to evaluate the vascular anatomy. CONTRAST:  100 cc Isovue COMPARISON:  None. FINDINGS: Cardiovascular: There are quantum mottle artifacts from patient's large body habitus. Heart size within  normal limits. No pericardial effusion. No aortic aneurysm. No pulmonary embolus is noted. No aortic dissection. Mediastinum: No mediastinal hematoma or adenopathy. No hilar adenopathy. Lungs/Pleura: Axial image 72 there is consolidation with some air bronchogram in left lower lobe posterior medially. This is confirmed on sagittal image 92. This is highly suspicious for infiltrate/pneumonia. There is no pulmonary edema. Minimal linear atelectasis or scarring noted in right lower lobe posterior medially. Otherwise the right lung is clear. No pulmonary nodules are noted. No pneumothorax. Upper Abdomen: Visualized upper abdomen shows mild fatty infiltration of the liver. Musculoskeletal: No destructive bony lesions are noted. Sagittal images of the spine are unremarkable. Review of the MIP images confirms the above findings. IMPRESSION: 1. No pulmonary embolus is noted. No mediastinal hematoma or adenopathy. 2. There is consolidation with some air bronchogram in left lower lobe posterior medially. This is highly suspicious for infiltrate/pneumonia. 3. No pulmonary edema.  No pneumothorax. Electronically Signed   By: Natasha Mead M.D.   On: 11/10/2015 17:22     Scheduled Meds: . albuterol  2.5 mg Nebulization Q4H  . benzonatate  200 mg Oral TID  . cefTRIAXone (ROCEPHIN)  IV  2 g Intravenous Q24H  . dextromethorphan-guaiFENesin  1 tablet Oral BID  . enoxaparin (LOVENOX) injection  40 mg Subcutaneous Q24H  . fluticasone  1 spray Each Nare BID  . loratadine  10 mg Oral Daily  . methylPREDNISolone (SOLU-MEDROL) injection  60 mg Intravenous TID  . montelukast  10 mg Oral QHS  . olopatadine  1 drop Both Eyes BID  . pantoprazole  40 mg Oral Daily  . polyethylene glycol  17 g Oral Daily   Continuous Infusions: . albuterol 5 mg/hr (11/08/15 2022)     LOS: 4 days    Time spent: 25 minutes    Debe Coder, MD [facility] Pager [phone]  If 7PM-7AM, please contact  night-coverage [email or web address] Password [email or web address] 11/11/2015, 2:04 PM

## 2015-11-12 DIAGNOSIS — J45901 Unspecified asthma with (acute) exacerbation: Secondary | ICD-10-CM

## 2015-11-12 LAB — CBC
HCT: 35.3 % — ABNORMAL LOW (ref 36.0–46.0)
Hemoglobin: 11.7 g/dL — ABNORMAL LOW (ref 12.0–15.0)
MCH: 27.1 pg (ref 26.0–34.0)
MCHC: 33.1 g/dL (ref 30.0–36.0)
MCV: 81.9 fL (ref 78.0–100.0)
PLATELETS: 354 10*3/uL (ref 150–400)
RBC: 4.31 MIL/uL (ref 3.87–5.11)
RDW: 15.5 % (ref 11.5–15.5)
WBC: 19.2 10*3/uL — AB (ref 4.0–10.5)

## 2015-11-12 LAB — PROCALCITONIN

## 2015-11-12 MED ORDER — BUDESONIDE 0.25 MG/2ML IN SUSP: 0.2500 mg | Freq: Two times a day (BID) | RESPIRATORY_TRACT | Status: DC

## 2015-11-12 MED ORDER — BUDESONIDE 0.5 MG/2ML IN SUSP
0.5000 mg | Freq: Two times a day (BID) | RESPIRATORY_TRACT | Status: DC
  Administered 2015-11-12 – 2015-11-14 (×5): 0.5 mg via RESPIRATORY_TRACT
  Filled 2015-11-12 (×6): qty 2

## 2015-11-12 MED ORDER — ENOXAPARIN SODIUM 80 MG/0.8ML ~~LOC~~ SOLN
0.5000 mg/kg | SUBCUTANEOUS | Status: DC
  Administered 2015-11-12 – 2015-11-14 (×3): 70 mg via SUBCUTANEOUS
  Filled 2015-11-12 (×4): qty 0.8

## 2015-11-12 MED ORDER — SODIUM CHLORIDE 0.9 % IV SOLN
1250.0000 mg | Freq: Three times a day (TID) | INTRAVENOUS | Status: DC
  Administered 2015-11-12 – 2015-11-14 (×5): 1250 mg via INTRAVENOUS
  Filled 2015-11-12 (×6): qty 1250

## 2015-11-12 MED ORDER — METHYLPREDNISOLONE SODIUM SUCC 125 MG IJ SOLR
60.0000 mg | Freq: Two times a day (BID) | INTRAMUSCULAR | Status: DC
  Administered 2015-11-12 – 2015-11-14 (×4): 60 mg via INTRAVENOUS
  Filled 2015-11-12 (×4): qty 2

## 2015-11-12 MED ORDER — PIPERACILLIN-TAZOBACTAM 3.375 G IVPB
3.3750 g | Freq: Three times a day (TID) | INTRAVENOUS | Status: DC
  Administered 2015-11-12 – 2015-11-14 (×6): 3.375 g via INTRAVENOUS
  Filled 2015-11-12 (×6): qty 50

## 2015-11-12 MED ORDER — VANCOMYCIN HCL 10 G IV SOLR
2500.0000 mg | Freq: Once | INTRAVENOUS | Status: AC
  Administered 2015-11-12: 2500 mg via INTRAVENOUS
  Filled 2015-11-12: qty 2000

## 2015-11-12 NOTE — Consult Note (Signed)
Name: Carrie MuskratShaunja D [name] MRN: [id number] DOB: 05/07/1996    ADMISSION DATE:  11/06/2015 CONSULTATION DATE:  11/12/15  REFERRING MD :  Dr. Sunnie Nielsenegalado / [facility]   CHIEF COMPLAINT:  Asthma Exacerbation   HISTORY OF PRESENT ILLNESS:  20 y/o F, Jehovah's Witness, with a PMH of migraines, morbid obesity, fibromyalgia, pseudotumor cerebri, and asthma who [facility] with reports of a three-day history of increased wheezing and shortness of breath.  The patient had been seen by her primary care physician prior to presentation and was treated with prednisone. She also had tried home nebulizers without significant relief of symptoms.  The patient also reported a cough with productive yellow / brown sputum. She reports she has not been taking Qvar for well over a year. She also has been on and off antibiotics for her wisdom teeth which have yet to be removed.  At baseline she uses albuterol 1-2 times per month.    Initial data reviewed - WBC 10.9, K3.3, normal serum creatinine, room air saturations of 98% and chest x-ray negative for acute infiltrate.  She was [facility]. She was treated with nebulized bronchodilators, Singulair, IV steroids, oral azithromycin & cough suppression.  The patient made slow progress with improvement. Given concerns for slow resolution a CT of the chest was evaluated which demonstrated an infiltrate in the left lower lobe, negative for PE. She completed 5 days of azithromycin.    PCCM consulted for slow to resolve asthma exacerbation, LLL infiltrate.   PAST MEDICAL HISTORY :   has a past medical history of Migraines; Asthma; Fibromyalgia; and Pseudotumor cerebri.  has no past surgical history on file.   Prior to Admission medications   Medication Sig Start Date End Date Taking? Authorizing Provider  albuterol (PROVENTIL HFA;VENTOLIN HFA) 108 (90 BASE) MCG/ACT inhaler Inhale 2 puffs into the lungs every 4 (four) hours as needed for  wheezing or shortness of breath.    Yes Historical Provider, MD  albuterol (PROVENTIL) (2.5 MG/3ML) 0.083% nebulizer solution Take 2.5 mg by nebulization every 6 (six) hours as needed for wheezing or shortness of breath.   Yes Historical Provider, MD  beclomethasone (QVAR) 40 MCG/ACT inhaler Inhale 2 puffs into the lungs 2 (two) times daily.     Yes Historical Provider, MD  cetirizine (ZYRTEC) 10 MG tablet Take 10 mg by mouth daily.     Yes Historical Provider, MD  DM-Doxylamine-Acetaminophen (VICKS NYQUIL COLD & FLU) 15-6.25-325 MG/15ML LIQD Take 30 mLs by mouth at bedtime as needed (for cold/sleep).   Yes Historical Provider, MD  fluticasone (FLONASE) 50 MCG/ACT nasal spray Place 1 spray into both nostrils daily.   Yes Historical Provider, MD  ibuprofen (ADVIL,MOTRIN) 800 MG tablet Take 800 mg by mouth every 6 (six) hours as needed for fever, headache, mild pain, moderate pain or cramping.   Yes Historical Provider, MD  montelukast (SINGULAIR) 10 MG tablet Take 10 mg by mouth at bedtime.   Yes Historical Provider, MD  Olopatadine HCl (PATADAY) 0.2 % SOLN Place 2 drops into both eyes daily.   Yes Historical Provider, MD  predniSONE (DELTASONE) 20 MG tablet Take 20 mg by mouth 2 (two) times daily with a meal. Started 06/19 for 5 days   Yes Historical Provider, MD  acetaZOLAMIDE (DIAMOX) 250 MG tablet One by mouth 4 times daily with meals and at bedtime. Patient not taking: Reported on 11/06/2015 12/14/12   Deetta PerlaWilliam H Hickling, MD  DULoxetine (CYMBALTA) 20 MG  capsule TAKE 2 CAPS BY MOUTH DAILY Patient not taking: Reported on 11/06/2015 04/11/13   Elveria Risingina Goodpasture, NP  furosemide (LASIX) 40 MG tablet Take 1+1/2 tablets every morning Patient not taking: Reported on 11/06/2015 04/11/13   Elveria Risingina Goodpasture, NP  traMADol (ULTRAM) 50 MG tablet TAKE 2 TABLETS BY MOUTH AT BEDTIME Patient not taking: Reported on 11/06/2015 04/11/13   Elveria Risingina Goodpasture, NP   Allergies  Allergen Reactions  . Kiwi Extract Itching     Tongue itching   . Pineapple Itching    Tongue itching   . Other     PT IS A JEHOVAH WITNESS. NO BLOOD PRODUCTS  . Percocet [Oxycodone-Acetaminophen] Other (See Comments)    vomitting and hard to arouse    FAMILY HISTORY:  family history includes Fibromyalgia in her mother; Migraines in her maternal aunt and mother; Other in her cousin; Stroke in her maternal grandmother; Thyroid cancer in her mother.   SOCIAL HISTORY:  reports that she has never smoked. She does not have any smokeless tobacco history on file.  REVIEW OF SYSTEMS:  POSITIVES IN BOLD Constitutional: Negative for fever, chills, weight loss, malaise/fatigue and diaphoresis.  HENT: Negative for hearing loss, ear pain, nosebleeds, congestion, sore throat, neck pain, tinnitus and ear discharge.   Eyes: Negative for blurred vision, double vision, photophobia, pain, discharge and redness.  Respiratory: Negative for cough, hemoptysis, sputum production, shortness of breath, pleuritic left chest pain, wheezing and stridor.   Cardiovascular: Negative for chest pain, palpitations, orthopnea, claudication, leg swelling and PND.  Gastrointestinal: Negative for heartburn, nausea, vomiting, abdominal pain, diarrhea, constipation, blood in stool and melena.  Genitourinary: Negative for dysuria, urgency, frequency, hematuria and flank pain.  Musculoskeletal: Negative for myalgias, back pain, joint pain and falls.  Skin: Negative for itching and rash.  Neurological: Negative for dizziness, tingling, tremors, sensory change, speech change, focal weakness, seizures, loss of consciousness, weakness and headaches.  Endo/Heme/Allergies: Negative for environmental allergies and polydipsia. Does not bruise/bleed easily.  SUBJECTIVE:   VITAL SIGNS: Temp:  [97.9 F (36.6 C)-99.2 F (37.3 C)] 98.2 F (36.8 C) (06/27 0515) Pulse Rate:  [69-96] 69 (06/27 0515) Resp:  [18-20] 20 (06/27 0515) BP: (123-130)/(69-81) 123/81 mmHg (06/27 0515) SpO2:   [95 %-98 %] 98 % (06/27 0957)  PHYSICAL EXAMINATION: General:  Morbidly obese female in NAD  Neuro:  AAOx4, speech clear, MAE  HEENT:  MM pink/moist, no jvd, no obvious broken / infected teeth  Cardiovascular:  s1s2 rrr, no m/r/g Lungs:  Even/non-labored, soft wheezing bilaterally with rhonchi LLL Abdomen:  Obese/soft, bsx4 active  Musculoskeletal:  No acute deformities Skin:  Warm/dry, no edema    Recent Labs Lab 11/09/15 0434 11/10/15 0521 11/11/15 0436  NA 137 137 135  K 4.2 4.2 4.3  CL 102 100* 100*  CO2 27 29 28   BUN 13 16 18   CREATININE 0.53 0.67 0.60  GLUCOSE 135* 126* 125*    Recent Labs Lab 11/09/15 0434 11/10/15 0521 11/11/15 0436  HGB 10.9* 11.2* 11.0*  HCT 32.7* 34.6* 33.8*  WBC 12.9* 15.6* 14.8*  PLT 319 342 342   Ct Angio Chest Pe W Or Wo Contrast  11/10/2015  CLINICAL DATA:  Shortness of breath, tachycardia, chest pain EXAM: CT ANGIOGRAPHY CHEST WITH CONTRAST TECHNIQUE: Multidetector CT imaging of the chest was performed using the standard protocol during bolus administration of intravenous contrast. Multiplanar CT image reconstructions and MIPs were obtained to evaluate the vascular anatomy. CONTRAST:  100 cc Isovue COMPARISON:  None. FINDINGS: Cardiovascular:  There are quantum mottle artifacts from patient's large body habitus. Heart size within normal limits. No pericardial effusion. No aortic aneurysm. No pulmonary embolus is noted. No aortic dissection. Mediastinum: No mediastinal hematoma or adenopathy. No hilar adenopathy. Lungs/Pleura: Axial image 72 there is consolidation with some air bronchogram in left lower lobe posterior medially. This is confirmed on sagittal image 92. This is highly suspicious for infiltrate/pneumonia. There is no pulmonary edema. Minimal linear atelectasis or scarring noted in right lower lobe posterior medially. Otherwise the right lung is clear. No pulmonary nodules are noted. No pneumothorax. Upper Abdomen: Visualized upper  abdomen shows mild fatty infiltration of the liver. Musculoskeletal: No destructive bony lesions are noted. Sagittal images of the spine are unremarkable. Review of the MIP images confirms the above findings. IMPRESSION: 1. No pulmonary embolus is noted. No mediastinal hematoma or adenopathy. 2. There is consolidation with some air bronchogram in left lower lobe posterior medially. This is highly suspicious for infiltrate/pneumonia. 3. No pulmonary edema.  No pneumothorax. Electronically Signed   By: Natasha MeadLiviu  Pop M.D.   On: 11/10/2015 17:22   SIGNIFICANT EVENTS  6/21  Admit with acute asthma exacerbation  STUDIES:  6/25  CTA Chest >> negative for PE, no lymphadenopathy, consolidation with air bronchograms in the left lower lobe posterior medially, no pulmonary edema or pneumothorax  ABX:  Azithromycin 6/21 >> 6/25  Rocephin 6/26 >> 6/27 Vanco 6/27 >> Zosyn 6/27 >>   CULTURES: RVP 6/21 >> negative  Sputum 6/23 >> not lower specimen  BCx2 6/21 >> negative     ASSESSMENT / PLAN:  Acute Asthma Exacerbation - She has been off maintenance medications > 233yr, RVP negative on admit.    Plan: Adjust pulmicort dosing to 0.5 BID Albuterol Q4 & Q2 PRN wheezing  Tessalon, Tussionex for cough  Continue Singulair, Flonase, Claritin  Adjsut Solumedrol to 60 mg BID  Will need pulmonary follow up post discharge with PFT's Will need Rx for QVAR at discharge as she has not been taking for > 1 year.  Consider transition to Advair ??   LLL PNA - no infiltrate on CXR on admit, has been On/Off abx for wisdom teeth swelling with concerns for infection  Plan: Adjust abx to vancomycin / zosyn for nosocomial coverage.  Initial CXR negative for infiltrate.  Trend CXR  Assess PCT  Monitor CBC, fever curve Flutter valve encouraged Needs to mobilize, OOB / pulmonary hygiene   Canary BrimBrandi Tobby Fawcett, NP-C [facility] Pgr: [phone] or if no answer [phone] 11/12/2015, 11:11 AM

## 2015-11-12 NOTE — Progress Notes (Addendum)
PROGRESS NOTE    Carrie [name]  WUJ:[id number] DOB: 06/16/1995 DOA: 11/06/2015 PCP: Lyda PeroneEES,JANET L, MD    Brief Narrative:  HPI on 11/06/2015 by Dr. Ronnald CollumXilin Niu Khaliah D [name] is a 20 y.o. female with medical history significant of asthma, depression, migraine headache and pseudotumor tumor cer tumor cerebri, fibromyalgia, who presents, wheezing and SOB. Patient reports that she has been having cough, wheezing and shortness of breath for several days, which has progressively getting worse. She was seen by her PCP 3 days ago, and given prescription for prednisone. She has been using inhaler and prednisone without any improvement. Patient coughs up brownish colored sputum, no chest pain, ffever or chills. Her shortness of breath has been progressively getting worse. She can still speak full sentences. Patient does not have vomiting, diarrhea, symptoms of UTI or unilateral weakness. She has mild nausea.   Interim history Admitted for asthma exacerbation. Improving, slowly.  Family and patient very concerned with course.  Reports never being admitted for asthma.  CT scan done on 6/25 showed a left lower lobe infiltrate consistent with pneumonia.    Assessment & Plan:   Asthma exacerbation with LLL pneumonia - CT scan of the chest showed no PE, but an infiltrate in the left lower lobe.  Interestingly, on exam today, she does have some rhonchi on that side now that wheezing is all but gone.  She also has ongoing pleuritic chest pain which has been mostly restricted to that side.     - Continue albuterol and flutter valve - Added  Rocephin 6-26  to Azithromycin - Continue steroids, singulair - WBC down to 14.8 - Renal function stable.  - Respiratory virus panel negative.  - HIV negative -add Pulmicort nebulizer. Will ask Pulmonology to see.  She report she felt worse with duo-neb.    Pseudotumor cerebri - No longer on therapy - No mention of headache today - If worsening or changing  headache, consider further testing or resumption of therapy     Migraine - Continue ibuprofen prn  Depression - Stable, no acute issue    Hypokalemia - Resolved  DVT Prophylaxis Lovenox  Code Status: Full  Family Communication: Mother at bedside  Disposition Plan: Admitted. Possible discharge within 1-2 days  Consultants:   None  Procedures:   None  Antimicrobials:  Azithromycin - received 5 days total  Ceftriaxone 6/26 - -> current   Subjective: Patient report feeling still very tight. Dyspnea  improved. Cough is now dry.   Objective: Filed Vitals:   11/11/15 2053 11/11/15 2323 11/12/15 0515 11/12/15 0957  BP: 125/72  123/81   Pulse: 85  69   Temp: 97.9 F (36.6 C)  98.2 F (36.8 C)   TempSrc: Oral  Oral   Resp: 18  20   Height:      Weight:      SpO2: 96% 95% 98% 98%    Intake/Output Summary (Last 24 hours) at 11/12/15 1037 Last data filed at 11/11/15 2100  Gross per 24 hour  Intake    762 ml  Output      0 ml  Net    762 ml   Filed Weights   11/06/15 1636  Weight: 135.2 kg (298 lb 1 oz)    Examination:  General exam: Appears calm and comfortable, no SOB at rest HENT: NCAT, MMM Respiratory system: Course breath sounds on the left, rhonchi in left lower lobe, wheezing resolved, coughing with deep breathing.     Cardiovascular system:  S1 & S2 heard, RR, NR.  Gastrointestinal system: Abdomen is nondistended, soft and nontender.  Central nervous system: Alert and oriented. No focal neurological deficits. Skin: No rashes, lesions or ulcers on extremities Psychiatry: Judgement and insight appear normal. Mood & affect appropriate.     Data Reviewed:   CBC:  Recent Labs Lab 11/06/15 1105 11/06/15 1344 11/08/15 0451 11/09/15 0434 11/10/15 0521 11/11/15 0436  WBC 10.9*  --  12.5* 12.9* 15.6* 14.8*  NEUTROABS 7.2  --   --   --   --   --   HGB 10.7* 11.2* 10.7* 10.9* 11.2* 11.0*  HCT 32.8* 33.0* 32.8* 32.7* 34.6* 33.8*  MCV 82.2  --   83.0 81.5 82.6 82.2  PLT 311  --  332 319 342 342   Basic Metabolic Panel:  Recent Labs Lab 11/06/15 1105 11/06/15 1344 11/07/15 0458 11/08/15 0451 11/09/15 0434 11/10/15 0521 11/11/15 0436  NA 138 141  --  136 137 137 135  K 3.3* 3.5  --  3.7 4.2 4.2 4.3  CL 104 102  --  102 102 100* 100*  CO2 28  --   --  25 27 29 28   GLUCOSE 111* 98  --  146* 135* 126* 125*  BUN 11 10  --  14 13 16 18   CREATININE 0.70 0.70  --  0.69 0.53 0.67 0.60  CALCIUM 8.9  --   --  9.0 9.0 9.0 8.7*  MG  --   --  2.1  --   --   --   --    Cardiac Enzymes:  Recent Labs Lab 11/09/15 1845  TROPONINI <0.03     Recent Results (from the past 240 hour(s))  Culture, blood (routine x 2) Call MD if unable to obtain prior to antibiotics being given     Status: None   Collection Time: 11/06/15  3:21 PM  Result Value Ref Range Status   Specimen Description BLOOD LEFT ANTECUBITAL  Final   Special Requests BOTTLES DRAWN AEROBIC AND ANAEROBIC 5 ML  Final   Culture   Final    NO GROWTH 5 DAYS [facility]    Report Status 11/11/2015 FINAL  Final  Culture, blood (routine x 2) Call MD if unable to obtain prior to antibiotics being given     Status: None   Collection Time: 11/06/15  3:26 PM  Result Value Ref Range Status   Specimen Description BLOOD RIGHT HAND  Final   Special Requests BOTTLES DRAWN AEROBIC AND ANAEROBIC 5 ML  Final   Culture   Final    NO GROWTH 5 DAYS Performed at [facility]     Report Status 11/11/2015 FINAL  Final  Respiratory Panel by PCR     Status: None   Collection Time: 11/06/15  5:42 PM  Result Value Ref Range Status   Adenovirus NOT DETECTED NOT DETECTED Final   Coronavirus 229E NOT DETECTED NOT DETECTED Final   Coronavirus HKU1 NOT DETECTED NOT DETECTED Final   Coronavirus NL63 NOT DETECTED NOT DETECTED Final   Coronavirus OC43 NOT DETECTED NOT DETECTED Final   Metapneumovirus NOT DETECTED NOT DETECTED Final   Rhinovirus / Enterovirus NOT  DETECTED NOT DETECTED Final   Influenza A NOT DETECTED NOT DETECTED Final   Influenza A H1 NOT DETECTED NOT DETECTED Final   Influenza A H1 2009 NOT DETECTED NOT DETECTED Final   Influenza A H3 NOT DETECTED NOT DETECTED Final   Influenza B NOT DETECTED  NOT DETECTED Final   Parainfluenza Virus 1 NOT DETECTED NOT DETECTED Final   Parainfluenza Virus 2 NOT DETECTED NOT DETECTED Final   Parainfluenza Virus 3 NOT DETECTED NOT DETECTED Final   Parainfluenza Virus 4 NOT DETECTED NOT DETECTED Final   Respiratory Syncytial Virus NOT DETECTED NOT DETECTED Final   Bordetella pertussis NOT DETECTED NOT DETECTED Final   Chlamydophila pneumoniae NOT DETECTED NOT DETECTED Final   Mycoplasma pneumoniae NOT DETECTED NOT DETECTED Final  Culture, sputum-assessment     Status: None   Collection Time: 11/08/15  3:16 PM  Result Value Ref Range Status   Specimen Description SPUTUM  Final   Special Requests NONE  Final   Sputum evaluation   Final    MICROSCOPIC FINDINGS SUGGEST THAT THIS SPECIMEN IS NOT REPRESENTATIVE OF LOWER RESPIRATORY SECRETIONS. PLEASE RECOLLECT. Gram Stain Report Called to,Read Back By and Verified With: Debroah Loop RN 6.23.17 @ 1605 BY RICEJ    Report Status 11/08/2015 FINAL  Final         Radiology Studies: Ct Angio Chest Pe W Or Wo Contrast  11/10/2015  CLINICAL DATA:  Shortness of breath, tachycardia, chest pain EXAM: CT ANGIOGRAPHY CHEST WITH CONTRAST TECHNIQUE: Multidetector CT imaging of the chest was performed using the standard protocol during bolus administration of intravenous contrast. Multiplanar CT image reconstructions and MIPs were obtained to evaluate the vascular anatomy. CONTRAST:  100 cc Isovue COMPARISON:  None. FINDINGS: Cardiovascular: There are quantum mottle artifacts from patient's large body habitus. Heart size within normal limits. No pericardial effusion. No aortic aneurysm. No pulmonary embolus is noted. No aortic dissection. Mediastinum: No  mediastinal hematoma or adenopathy. No hilar adenopathy. Lungs/Pleura: Axial image 72 there is consolidation with some air bronchogram in left lower lobe posterior medially. This is confirmed on sagittal image 92. This is highly suspicious for infiltrate/pneumonia. There is no pulmonary edema. Minimal linear atelectasis or scarring noted in right lower lobe posterior medially. Otherwise the right lung is clear. No pulmonary nodules are noted. No pneumothorax. Upper Abdomen: Visualized upper abdomen shows mild fatty infiltration of the liver. Musculoskeletal: No destructive bony lesions are noted. Sagittal images of the spine are unremarkable. Review of the MIP images confirms the above findings. IMPRESSION: 1. No pulmonary embolus is noted. No mediastinal hematoma or adenopathy. 2. There is consolidation with some air bronchogram in left lower lobe posterior medially. This is highly suspicious for infiltrate/pneumonia. 3. No pulmonary edema.  No pneumothorax. Electronically Signed   By: Natasha Mead M.D.   On: 11/10/2015 17:22     Scheduled Meds: . albuterol  2.5 mg Nebulization Q4H  . benzonatate  200 mg Oral TID  . budesonide (PULMICORT) nebulizer solution  0.25 mg Nebulization BID  . cefTRIAXone (ROCEPHIN)  IV  2 g Intravenous Q24H  . dextromethorphan-guaiFENesin  1 tablet Oral BID  . enoxaparin (LOVENOX) injection  40 mg Subcutaneous Q24H  . fluticasone  1 spray Each Nare BID  . loratadine  10 mg Oral Daily  . methylPREDNISolone (SOLU-MEDROL) injection  60 mg Intravenous TID  . montelukast  10 mg Oral QHS  . olopatadine  1 drop Both Eyes BID  . pantoprazole  40 mg Oral Daily  . polyethylene glycol  17 g Oral Daily   Continuous Infusions: . albuterol 5 mg/hr (11/08/15 2022)     LOS: 5 days    Time spent: 25 minutes    Alba Cory, MD [facility] Pager [phone]  If 7PM-7AM, please contact night-coverage [email or web address] Password [email or web address]  11/12/2015, 10:37 AM

## 2015-11-12 NOTE — Progress Notes (Signed)
Pharmacy Antibiotic Note  Carrie [name] is a 20 y.o. female admitted on 11/06/2015 with pneumonia.  Pharmacy has been consulted for Vanc/Zosyn dosing for HAP.   Plan: Vancomycin 2500mg  IV x1 then 1250mg  IV every 8 hours.  Goal trough 15-20 mcg/mL. Zosyn 3.375g IV q8h (4 hour infusion).  Height: 5' (152.4 cm) Weight: 298 lb 1 oz (135.2 kg) IBW/kg (Calculated) : 45.5  Temp (24hrs), Avg:98.4 F (36.9 C), Min:97.9 F (36.6 C), Max:99.2 F (37.3 C)   Recent Labs Lab 11/06/15 1344 11/08/15 0451 11/09/15 0434 11/10/15 0521 11/11/15 0436 11/12/15 1128  WBC  --  12.5* 12.9* 15.6* 14.8* 19.2*  CREATININE 0.70 0.69 0.53 0.67 0.60  --     Estimated Creatinine Clearance: 144.1 mL/min (by C-G formula based on Cr of 0.6).    Allergies  Allergen Reactions  . Kiwi Extract Itching    Tongue itching   . Pineapple Itching    Tongue itching   . Other     PT IS A JEHOVAH WITNESS. NO BLOOD PRODUCTS  . Percocet [Oxycodone-Acetaminophen] Other (See Comments)    vomitting and hard to arouse   Antimicrobials this admission: 6/21 >> azithromycin >> 6/25 6/26 >> ceftriaxone >> 6/27 6/27 Zosyn >> 6/27 Vanc >>  Dose adjustments this admission:   Microbiology results: 6/21 BCx: NGTD 6/21 Resp panel: neg 6/23 Sputum: poor sample   Thank you for allowing pharmacy to be a part of this patient's care.  Hessie KnowsJustin M Gabriela Irigoyen, PharmD, BCPS Pager [phone] 11/12/2015 12:26 PM

## 2015-11-13 DIAGNOSIS — G932 Benign intracranial hypertension: Secondary | ICD-10-CM

## 2015-11-13 DIAGNOSIS — E876 Hypokalemia: Secondary | ICD-10-CM

## 2015-11-13 LAB — CBC
HEMATOCRIT: 33.3 % — AB (ref 36.0–46.0)
Hemoglobin: 10.9 g/dL — ABNORMAL LOW (ref 12.0–15.0)
MCH: 26.5 pg (ref 26.0–34.0)
MCHC: 32.7 g/dL (ref 30.0–36.0)
MCV: 81 fL (ref 78.0–100.0)
PLATELETS: 312 10*3/uL (ref 150–400)
RBC: 4.11 MIL/uL (ref 3.87–5.11)
RDW: 15.6 % — ABNORMAL HIGH (ref 11.5–15.5)
WBC: 16.9 10*3/uL — ABNORMAL HIGH (ref 4.0–10.5)

## 2015-11-13 MED ORDER — FUROSEMIDE 40 MG PO TABS
40.0000 mg | ORAL_TABLET | Freq: Every day | ORAL | Status: DC
  Administered 2015-11-13 – 2015-11-15 (×3): 40 mg via ORAL
  Filled 2015-11-13: qty 2
  Filled 2015-11-13 (×2): qty 1

## 2015-11-13 MED ORDER — KETOROLAC TROMETHAMINE 30 MG/ML IJ SOLN
30.0000 mg | Freq: Once | INTRAMUSCULAR | Status: AC
  Administered 2015-11-13: 30 mg via INTRAVENOUS
  Filled 2015-11-13: qty 1

## 2015-11-13 MED ORDER — HYDROCODONE-ACETAMINOPHEN 5-325 MG PO TABS
1.0000 | ORAL_TABLET | Freq: Once | ORAL | Status: AC
  Administered 2015-11-13: 1 via ORAL
  Filled 2015-11-13: qty 1

## 2015-11-13 NOTE — Progress Notes (Signed)
PROGRESS NOTE    Carrie [name]  ZOX:[id number] DOB: 08/27/1995 DOA: 11/06/2015 PCP: Lyda PeroneEES,JANET L, MD    Brief Narrative:  HPI on 11/06/2015 by Dr. Ronnald CollumXilin Niu [name] is a 20 y.o. female with medical history significant of asthma, depression, migraine headache and pseudotumor tumor cer tumor cerebri, fibromyalgia, who presents, wheezing and SOB. Patient reports that she has been having cough, wheezing and shortness of breath for several days, which has progressively getting worse. She was seen by her PCP 3 days ago, and given prescription for prednisone. She has been using inhaler and prednisone without any improvement. Patient coughs up brownish colored sputum, no chest pain, ffever or chills. Her shortness of breath has been progressively getting worse. She can still speak full sentences. Patient does not have vomiting, diarrhea, symptoms of UTI or unilateral weakness. She has mild nausea.   Interim history Admitted for asthma exacerbation. Improving, slowly.  Family and patient very concerned with course.  Reports never being admitted for asthma.  CT scan done on 6/25 showed a left lower lobe infiltrate consistent with pneumonia. Will follow up pulmonary service rec's.   Assessment & Plan:   Asthma exacerbation with LLL pneumonia - CT scan of the chest showed no PE, but an infiltrate in the left lower lobe.  Interestingly, on exam today, she does have some rhonchi on that side now that wheezing is all but gone.  She also has ongoing pleuritic chest pain which has been mostly restricted to that side.     - Continue albuterol and flutter valve - will continue pulmicort -at discharge will use Advair -will need PFT's and further adjustment to maintenance therapy at discharge -per pulmonary recommendations will continue broad spectrum antibiotics for now; images suggesting PNA and unable to r/o if CAP vs HCAP (CXR on admission w/o infiltrates) -WBC's trending down -continue  current dose of steroids -will follow up pulmonary service rec's  Pseudotumor cerebri - No longer on therapy - reports feeling fluid is building up - will resume home lasix     Migraine - Continue ibuprofen prn  Depression - Stable, no acute issue    Hypokalemia - Resolved -will follow electrolytes intermittently and replete as needed   DVT Prophylaxis Lovenox  Code Status: Full  Family Communication: Mother at bedside  Disposition Plan: Admitted. Possible discharge within 1-2 days  Consultants:   Pulmonary service   Procedures:   See below for x-ray reports   Antimicrobials:  Azithromycin - received 5 days total  Ceftriaxone 6/26 - ->>>6/27  Vancomycin 6/27  Zosyn 6/27   Subjective: Patient report feeling still very tight and SOB with minimal activity. Cough is now dry, but still present. No fever.   Objective: Filed Vitals:   11/13/15 0955 11/13/15 1250 11/13/15 1257 11/13/15 1535  BP:  141/87    Pulse:  95    Temp:  98.1 F (36.7 C)    TempSrc:  Oral    Resp:  18    Height:      Weight:      SpO2: 94% 95% 95% 96%    Intake/Output Summary (Last 24 hours) at 11/13/15 1844 Last data filed at 11/13/15 1807  Gross per 24 hour  Intake   1080 ml  Output   2700 ml  Net  -1620 ml   Filed Weights   11/06/15 1636  Weight: 135.2 kg (298 lb 1 oz)    Examination:  General exam: Appears calm and comfortable, reports feeling less SOB;  but still with coughing spells. No fever. HENT: NCAT, MMM Respiratory system: Course breath sounds on the left, rhonchi in left lower lobe, no frank wheezing, coughing with deep breathing appreciated. Also reported pleuritic CP with coughing .     Cardiovascular system: S1 & S2 heard, RR, NR.  Gastrointestinal system: Abdomen is nondistended, soft and nontender.  Central nervous system: Alert and oriented. No focal neurological deficits. Skin: No rashes, lesions or ulcers on extremities Psychiatry: Judgement and  insight appear normal. Mood & affect appropriate.     Data Reviewed:   CBC:  Recent Labs Lab 11/09/15 0434 11/10/15 0521 11/11/15 0436 11/12/15 1128 11/13/15 0520  WBC 12.9* 15.6* 14.8* 19.2* 16.9*  HGB 10.9* 11.2* 11.0* 11.7* 10.9*  HCT 32.7* 34.6* 33.8* 35.3* 33.3*  MCV 81.5 82.6 82.2 81.9 81.0  PLT 319 342 342 354 312   Basic Metabolic Panel:  Recent Labs Lab 11/07/15 0458 11/08/15 0451 11/09/15 0434 11/10/15 0521 11/11/15 0436  NA  --  136 137 137 135  K  --  3.7 4.2 4.2 4.3  CL  --  102 102 100* 100*  CO2  --  25 27 29 28   GLUCOSE  --  146* 135* 126* 125*  BUN  --  14 13 16 18   CREATININE  --  0.69 0.53 0.67 0.60  CALCIUM  --  9.0 9.0 9.0 8.7*  MG 2.1  --   --   --   --    Cardiac Enzymes:  Recent Labs Lab 11/09/15 1845  TROPONINI <0.03     Recent Results (from the past 240 hour(s))  Culture, blood (routine x 2) Call MD if unable to obtain prior to antibiotics being given     Status: None   Collection Time: 11/06/15  3:21 PM  Result Value Ref Range Status   Specimen Description BLOOD LEFT ANTECUBITAL  Final   Special Requests BOTTLES DRAWN AEROBIC AND ANAEROBIC 5 ML  Final   Culture   Final    NO GROWTH 5 DAYS [facility]    Report Status 11/11/2015 FINAL  Final  Culture, blood (routine x 2) Call MD if unable to obtain prior to antibiotics being given     Status: None   Collection Time: 11/06/15  3:26 PM  Result Value Ref Range Status   Specimen Description BLOOD RIGHT HAND  Final   Special Requests BOTTLES DRAWN AEROBIC AND ANAEROBIC 5 ML  Final   Culture   Final    NO GROWTH 5 DAYS [facility]    Report Status 11/11/2015 FINAL  Final  Respiratory Panel by PCR     Status: None   Collection Time: 11/06/15  5:42 PM  Result Value Ref Range Status   Adenovirus NOT DETECTED NOT DETECTED Final   Coronavirus 229E NOT DETECTED NOT DETECTED Final   Coronavirus HKU1 NOT DETECTED NOT DETECTED Final    Coronavirus NL63 NOT DETECTED NOT DETECTED Final   Coronavirus OC43 NOT DETECTED NOT DETECTED Final   Metapneumovirus NOT DETECTED NOT DETECTED Final   Rhinovirus / Enterovirus NOT DETECTED NOT DETECTED Final   Influenza A NOT DETECTED NOT DETECTED Final   Influenza A H1 NOT DETECTED NOT DETECTED Final   Influenza A H1 2009 NOT DETECTED NOT DETECTED Final   Influenza A H3 NOT DETECTED NOT DETECTED Final   Influenza B NOT DETECTED NOT DETECTED Final   Parainfluenza Virus 1 NOT DETECTED NOT DETECTED Final   Parainfluenza Virus  2 NOT DETECTED NOT DETECTED Final   Parainfluenza Virus 3 NOT DETECTED NOT DETECTED Final   Parainfluenza Virus 4 NOT DETECTED NOT DETECTED Final   Respiratory Syncytial Virus NOT DETECTED NOT DETECTED Final   Bordetella pertussis NOT DETECTED NOT DETECTED Final   Chlamydophila pneumoniae NOT DETECTED NOT DETECTED Final   Mycoplasma pneumoniae NOT DETECTED NOT DETECTED Final  Culture, sputum-assessment     Status: None   Collection Time: 11/08/15  3:16 PM  Result Value Ref Range Status   Specimen Description SPUTUM  Final   Special Requests NONE  Final   Sputum evaluation   Final    MICROSCOPIC FINDINGS SUGGEST THAT THIS SPECIMEN IS NOT REPRESENTATIVE OF LOWER RESPIRATORY SECRETIONS. PLEASE RECOLLECT. Gram Stain Report Called to,Read Back By and Verified With: Debroah Loop RN 6.23.17 @ 1605 BY RICEJ    Report Status 11/08/2015 FINAL  Final     Radiology Studies: No results found.   Scheduled Meds: . albuterol  2.5 mg Nebulization Q4H  . benzonatate  200 mg Oral TID  . budesonide (PULMICORT) nebulizer solution  0.5 mg Nebulization BID  . dextromethorphan-guaiFENesin  1 tablet Oral BID  . enoxaparin (LOVENOX) injection  0.5 mg/kg Subcutaneous Q24H  . fluticasone  1 spray Each Nare BID  . furosemide  40 mg Oral Daily  . loratadine  10 mg Oral Daily  . methylPREDNISolone (SOLU-MEDROL) injection  60 mg Intravenous Q12H  . montelukast  10 mg Oral QHS   . olopatadine  1 drop Both Eyes BID  . pantoprazole  40 mg Oral Daily  . piperacillin-tazobactam (ZOSYN)  IV  3.375 g Intravenous Q8H  . polyethylene glycol  17 g Oral Daily  . vancomycin  1,250 mg Intravenous Q8H   Continuous Infusions: . albuterol 5 mg/hr (11/08/15 2022)     LOS: 6 days    Time spent: 25 minutes    Vassie Loll, MD [facility] Pager [phone]  If 7PM-7AM, please contact night-coverage [email or web address] Password [facility] 11/13/2015, 6:44 PM

## 2015-11-13 NOTE — Consult Note (Signed)
Name: Carrie MuskratShaunja D Pleitez MRN: [id number] DOB: 05/07/1996    ADMISSION DATE:  11/06/2015 CONSULTATION DATE:  11/12/15  REFERRING MD :  Dr. Sunnie Nielsenegalado / [facility]   CHIEF COMPLAINT:  Asthma Exacerbation   HISTORY OF PRESENT ILLNESS:  20 y/o F, Jehovah's Witness, with a PMH of migraines, morbid obesity, fibromyalgia, pseudotumor cerebri, and asthma who [facility] with reports of a three-day history of increased wheezing and shortness of breath.  The patient had been seen by her primary care physician prior to presentation and was treated with prednisone. She also had tried home nebulizers without significant relief of symptoms.  The patient also reported a cough with productive yellow / brown sputum. She reports she has not been taking Qvar for well over a year. She also has been on and off antibiotics for her wisdom teeth which have yet to be removed.  At baseline she uses albuterol 1-2 times per month.    Initial data reviewed - WBC 10.9, K3.3, normal serum creatinine, room air saturations of 98% and chest x-ray negative for acute infiltrate.  She [facility]. She was treated with nebulized bronchodilators, Singulair, IV steroids, oral azithromycin & cough suppression.  The patient made slow progress with improvement. Given concerns for slow resolution a CT of the chest was evaluated which demonstrated an infiltrate in the left lower lobe, negative for PE. She completed 5 days of azithromycin.    PCCM consulted for slow to resolve asthma exacerbation, LLL infiltrate.   PAST MEDICAL HISTORY :   has a past medical history of Migraines; Asthma; Fibromyalgia; and Pseudotumor cerebri.  has no past surgical history on file.   Prior to Admission medications   Medication Sig Start Date End Date Taking? Authorizing Provider  albuterol (PROVENTIL HFA;VENTOLIN HFA) 108 (90 BASE) MCG/ACT inhaler Inhale 2 puffs into the lungs every 4 (four) hours as needed for  wheezing or shortness of breath.    Yes Historical Provider, MD  albuterol (PROVENTIL) (2.5 MG/3ML) 0.083% nebulizer solution Take 2.5 mg by nebulization every 6 (six) hours as needed for wheezing or shortness of breath.   Yes Historical Provider, MD  beclomethasone (QVAR) 40 MCG/ACT inhaler Inhale 2 puffs into the lungs 2 (two) times daily.     Yes Historical Provider, MD  cetirizine (ZYRTEC) 10 MG tablet Take 10 mg by mouth daily.     Yes Historical Provider, MD  DM-Doxylamine-Acetaminophen (VICKS NYQUIL COLD & FLU) 15-6.25-325 MG/15ML LIQD Take 30 mLs by mouth at bedtime as needed (for cold/sleep).   Yes Historical Provider, MD  fluticasone (FLONASE) 50 MCG/ACT nasal spray Place 1 spray into both nostrils daily.   Yes Historical Provider, MD  ibuprofen (ADVIL,MOTRIN) 800 MG tablet Take 800 mg by mouth every 6 (six) hours as needed for fever, headache, mild pain, moderate pain or cramping.   Yes Historical Provider, MD  montelukast (SINGULAIR) 10 MG tablet Take 10 mg by mouth at bedtime.   Yes Historical Provider, MD  Olopatadine HCl (PATADAY) 0.2 % SOLN Place 2 drops into both eyes daily.   Yes Historical Provider, MD  predniSONE (DELTASONE) 20 MG tablet Take 20 mg by mouth 2 (two) times daily with a meal. Started 06/19 for 5 days   Yes Historical Provider, MD  acetaZOLAMIDE (DIAMOX) 250 MG tablet One by mouth 4 times daily with meals and at bedtime. Patient not taking: Reported on 11/06/2015 12/14/12   Deetta PerlaWilliam H Hickling, MD  DULoxetine (CYMBALTA) 20 MG  capsule TAKE 2 CAPS BY MOUTH DAILY Patient not taking: Reported on 11/06/2015 04/11/13   Elveria Risingina Goodpasture, NP  furosemide (LASIX) 40 MG tablet Take 1+1/2 tablets every morning Patient not taking: Reported on 11/06/2015 04/11/13   Elveria Risingina Goodpasture, NP  traMADol (ULTRAM) 50 MG tablet TAKE 2 TABLETS BY MOUTH AT BEDTIME Patient not taking: Reported on 11/06/2015 04/11/13   Elveria Risingina Goodpasture, NP   Allergies  Allergen Reactions  . Kiwi Extract Itching     Tongue itching   . Pineapple Itching    Tongue itching   . Other     PT IS A JEHOVAH WITNESS. NO BLOOD PRODUCTS  . Percocet [Oxycodone-Acetaminophen] Other (See Comments)    vomitting and hard to arouse    FAMILY HISTORY:  family history includes Fibromyalgia in her mother; Migraines in her maternal aunt and mother; Other in her cousin; Stroke in her maternal grandmother; Thyroid cancer in her mother.   SOCIAL HISTORY:  reports that she has never smoked. She does not have any smokeless tobacco history on file.  REVIEW OF SYSTEMS:  POSITIVES IN BOLD Constitutional: Negative for fever, chills, weight loss, malaise/fatigue and diaphoresis.  HENT: Negative for hearing loss, ear pain, nosebleeds, congestion, sore throat, neck pain, tinnitus and ear discharge.   Eyes: Negative for blurred vision, double vision, photophobia, pain, discharge and redness.  Respiratory: Negative for cough, hemoptysis, sputum production, shortness of breath, pleuritic left chest pain, wheezing and stridor.   Cardiovascular: Negative for chest pain, palpitations, orthopnea, claudication, leg swelling and PND.  Gastrointestinal: Negative for heartburn, nausea, vomiting, abdominal pain, diarrhea, constipation, blood in stool and melena.  Genitourinary: Negative for dysuria, urgency, frequency, hematuria and flank pain.  Musculoskeletal: Negative for myalgias, back pain, joint pain and falls.  Skin: Negative for itching and rash.  Neurological: Negative for dizziness, tingling, tremors, sensory change, speech change, focal weakness, seizures, loss of consciousness, weakness and headaches.  Endo/Heme/Allergies: Negative for environmental allergies and polydipsia. Does not bruise/bleed easily.  SUBJECTIVE:   VITAL SIGNS: Temp:  [98.2 F (36.8 C)-99.3 F (37.4 C)] 98.2 F (36.8 C) (06/28 0609) Pulse Rate:  [75-96] 82 (06/28 0609) Resp:  [18-20] 18 (06/28 0609) BP: (125-152)/(72-86) 125/86 mmHg (06/28 0609) SpO2:   [94 %-97 %] 94 % (06/28 0955)  PHYSICAL EXAMINATION: General:  Morbidly obese female in NAD  Neuro:  AAOx4, speech clear, MAE  HEENT:  Moist mucus membranes Cardiovascular:  S1, S2, RRR, No MRG Lungs:  Even/non-labored, B/L rhonchi, improved wheezing Abdomen:  Obese/soft, + BS Musculoskeletal:  No acute deformities Skin:  Warm/dry, no edema    Recent Labs Lab 11/09/15 0434 11/10/15 0521 11/11/15 0436  NA 137 137 135  K 4.2 4.2 4.3  CL 102 100* 100*  CO2 27 29 28   BUN 13 16 18   CREATININE 0.53 0.67 0.60  GLUCOSE 135* 126* 125*    Recent Labs Lab 11/11/15 0436 11/12/15 1128 11/13/15 0520  HGB 11.0* 11.7* 10.9*  HCT 33.8* 35.3* 33.3*  WBC 14.8* 19.2* 16.9*  PLT 342 354 312   No results found. SIGNIFICANT EVENTS  6/21  Admit with acute asthma exacerbation  STUDIES:  6/25  CTA Chest >> negative for PE, no lymphadenopathy, consolidation with air bronchograms in the left lower lobe posterior medially, no pulmonary edema or pneumothorax  ABX:  Azithromycin 6/21 >> 6/25  Rocephin 6/26 >> 6/27 Vanco 6/27 >> Zosyn 6/27 >>   CULTURES: RVP 6/21 >> negative  Sputum 6/23 >> not lower specimen  BCx2 6/21 >>  negative   ASSESSMENT / PLAN: Acute Asthma Exacerbation - She has been off maintenance medications > 82yr, RVP negative on admit.   Plan: Continue pulmicort dosing to 0.5 BID Albuterol Q4 & Q2 PRN wheezing  Tessalon, Tussionex for cough  Continue Singulair, Flonase, Claritin  Continue Solumedrol to 60 mg BID    LLL PNA - no infiltrate on CXR on admit, has been On/Off abx for wisdom teeth swelling with concerns for infection  Plan: On vancomycin / zosyn for nosocomial coverage.  Monitor CBC, fever curve Flutter valve encouraged Mobilize, OOB / pulmonary hygiene   Chilton Greathouse MD [facility] Pager [phone] If no answer or after 3pm call: [phone] 11/13/2015, 12:18 PM

## 2015-11-14 DIAGNOSIS — G43809 Other migraine, not intractable, without status migrainosus: Secondary | ICD-10-CM

## 2015-11-14 LAB — PROCALCITONIN

## 2015-11-14 MED ORDER — METHYLPREDNISOLONE SODIUM SUCC 125 MG IJ SOLR
60.0000 mg | INTRAMUSCULAR | Status: DC
  Administered 2015-11-15: 60 mg via INTRAVENOUS
  Filled 2015-11-14: qty 2

## 2015-11-14 MED ORDER — LEVOFLOXACIN 750 MG PO TABS
750.0000 mg | ORAL_TABLET | Freq: Every day | ORAL | Status: DC
  Administered 2015-11-14 – 2015-11-15 (×2): 750 mg via ORAL
  Filled 2015-11-14 (×2): qty 1

## 2015-11-14 MED ORDER — NYSTATIN 100000 UNIT/ML MT SUSP
5.0000 mL | Freq: Four times a day (QID) | OROMUCOSAL | Status: DC
  Administered 2015-11-14 – 2015-11-15 (×4): 500000 [IU] via ORAL
  Filled 2015-11-14 (×4): qty 5

## 2015-11-14 NOTE — Progress Notes (Signed)
PROGRESS NOTE    Carrie MuskratShaunja D [name]  ZOX:[id number] DOB: 07/17/1995 DOA: 11/06/2015 PCP: Lyda PeroneEES,JANET L, MD    Brief Narrative:  HPI on 11/06/2015 by Dr. Ronnald CollumXilin Niu Carrie [name] is a 20 y.o. female with medical history significant of asthma, depression, migraine headache and pseudotumor tumor cer tumor cerebri, fibromyalgia, who presents, wheezing and SOB. Patient reports that she has been having cough, wheezing and shortness of breath for several days, which has progressively getting worse. She was seen by her PCP 3 days ago, and given prescription for prednisone. She has been using inhaler and prednisone without any improvement. Patient coughs up brownish colored sputum, no chest pain, ffever or chills. Her shortness of breath has been progressively getting worse. She can still speak full sentences. Patient does not have vomiting, diarrhea, symptoms of UTI or unilateral weakness. She has mild nausea.   Interim history Admitted for asthma exacerbation. Improving, slowly.  Family and patient very concerned with course.  Reports never being admitted for asthma.  CT scan done on 6/25 showed a left lower lobe infiltrate consistent with pneumonia. Will follow up pulmonary service rec's. Most likely home in the next 24 hours.   Assessment & Plan:  Asthma exacerbation with LLL pneumonia - CT scan of the chest showed no PE, but an infiltrate in the left lower lobe.  Interestingly, on exam today, she does have some rhonchi on that side now that wheezing is all but gone.  She also has ongoing pleuritic chest pain which has been mostly restricted to that side.     -Continue albuterol and flutter valve -will continue pulmicort while inpatient -at discharge will use Advair -will need PFT's and further adjustment to maintenance therapy at discharge -per pulmonary recommendations will continue broad spectrum antibiotics for now; images suggesting PNA and unable to r/o if CAP vs HCAP (CXR on admission  w/o infiltrates) -WBC's trending down; no fever and overall feeling better -continue solumedrol, but change to q24 hours and with plans to slowly tapered over 16 days at discharge -will follow up pulmonary service rec's  Pseudotumor cerebri - No longer on therapy - reports feeling fluid is building up - will resume home lasix  -outpatient follow up with her neurologist recommended     Migraine - Continue ibuprofen prn  Depression - Stable, no acute issue    Hypokalemia -Resolved -will follow electrolytes intermittently and replete as needed   Morbid obesity -Body mass index is 58.21 kg/(m^2). -advise to follow low calorie diet and to increase exercise -patient motivated and planning to performed lifestyle changes -will need close follow up at discharge and if needed referral to bariatric clinic   DVT Prophylaxis Lovenox   Code Status: Full  Family Communication: Mother at bedside  Disposition Plan: Admitted. Possible discharge within next 24 hours.   Consultants:   Pulmonary service   Procedures:   See below for x-ray reports   Antimicrobials:  Azithromycin - received 5 days total  Ceftriaxone 6/26 - ->>>6/27  Vancomycin 6/27>>>6/29  Zosyn 6/27>>>6/29  Levaquin 6/29   Subjective: Patient report feeling much better and endorses some SOB mainly on exertion. Cough is now dry, but still present (even improving). No fever.   Objective: Filed Vitals:   11/13/15 2304 11/14/15 0448 11/14/15 0748 11/14/15 0757  BP:  131/80    Pulse: 86 87    Temp:  99.6 F (37.6 C)    TempSrc:  Oral    Resp: 20 20    Height:  Weight:      SpO2: 97% 96% 95% 95%    Intake/Output Summary (Last 24 hours) at 11/14/15 1207 Last data filed at 11/14/15 0300  Gross per 24 hour  Intake    290 ml  Output   2300 ml  Net  -2010 ml   Filed Weights   11/06/15 1636  Weight: 135.2 kg (298 lb 1 oz)    Examination:  General exam: Appears calm and comfortable, reports  feeling less SOB; but still with some coughing spells. No fever. HENT: NCAT, MMM Respiratory system: Course breath sounds on the left, rhonchi in left lower lobe, no frank wheezing, coughing with deep breathing appreciated. Also reported pleuritic CP with coughing .     Cardiovascular system: S1 & S2 heard, RR, NR.  Gastrointestinal system: Abdomen is nondistended, soft and nontender.  Central nervous system: Alert and oriented. No focal neurological deficits. Skin: No rashes, lesions or ulcers on extremities Psychiatry: Judgement and insight appear normal. Mood & affect appropriate.     Data Reviewed:   CBC:  Recent Labs Lab 11/09/15 0434 11/10/15 0521 11/11/15 0436 11/12/15 1128 11/13/15 0520  WBC 12.9* 15.6* 14.8* 19.2* 16.9*  HGB 10.9* 11.2* 11.0* 11.7* 10.9*  HCT 32.7* 34.6* 33.8* 35.3* 33.3*  MCV 81.5 82.6 82.2 81.9 81.0  PLT 319 342 342 354 312   Basic Metabolic Panel:  Recent Labs Lab 11/08/15 0451 11/09/15 0434 11/10/15 0521 11/11/15 0436  NA 136 137 137 135  K 3.7 4.2 4.2 4.3  CL 102 102 100* 100*  CO2 GLUCOSE 146* 135* 126* 125*  BUN CREATININE 0.69 0.53 0.67 0.60  CALCIUM 9.0 9.0 9.0 8.7*   Cardiac Enzymes:  Recent Labs Lab 11/09/15 1845  TROPONINI <0.03    Recent Results (from the past 240 hour(s))  Culture, blood (routine x 2) Call MD if unable to obtain prior to antibiotics being given     Status: None   Collection Time: 11/06/15  3:21 PM  Result Value Ref Range Status   Specimen Description BLOOD LEFT ANTECUBITAL  Final   Special Requests BOTTLES DRAWN AEROBIC AND ANAEROBIC 5 ML  Final   Culture   Final    NO GROWTH 5 DAYS Performed at [facility]    Report Status 11/11/2015 FINAL  Final  Culture, blood (routine x 2) Call MD if unable to obtain prior to antibiotics being given     Status: None   Collection Time: 11/06/15  3:26 PM  Result Value Ref Range Status   Specimen Description BLOOD RIGHT HAND   Final   Special Requests BOTTLES DRAWN AEROBIC AND ANAEROBIC 5 ML  Final   Culture   Final    NO GROWTH 5 DAYS Performed at [facility]    Report Status 11/11/2015 FINAL  Final  Respiratory Panel by PCR     Status: None   Collection Time: 11/06/15  5:42 PM  Result Value Ref Range Status   Adenovirus NOT DETECTED NOT DETECTED Final   Coronavirus 229E NOT DETECTED NOT DETECTED Final   Coronavirus HKU1 NOT DETECTED NOT DETECTED Final   Coronavirus NL63 NOT DETECTED NOT DETECTED Final   Coronavirus OC43 NOT DETECTED NOT DETECTED Final   Metapneumovirus NOT DETECTED NOT DETECTED Final   Rhinovirus / Enterovirus NOT DETECTED NOT DETECTED Final   Influenza A NOT DETECTED NOT DETECTED Final   Influenza A H1 NOT DETECTED NOT DETECTED Final   Influenza  A H1 2009 NOT DETECTED NOT DETECTED Final   Influenza A H3 NOT DETECTED NOT DETECTED Final   Influenza B NOT DETECTED NOT DETECTED Final   Parainfluenza Virus 1 NOT DETECTED NOT DETECTED Final   Parainfluenza Virus 2 NOT DETECTED NOT DETECTED Final   Parainfluenza Virus 3 NOT DETECTED NOT DETECTED Final   Parainfluenza Virus 4 NOT DETECTED NOT DETECTED Final   Respiratory Syncytial Virus NOT DETECTED NOT DETECTED Final   Bordetella pertussis NOT DETECTED NOT DETECTED Final   Chlamydophila pneumoniae NOT DETECTED NOT DETECTED Final   Mycoplasma pneumoniae NOT DETECTED NOT DETECTED Final  Culture, sputum-assessment     Status: None   Collection Time: 11/08/15  3:16 PM  Result Value Ref Range Status   Specimen Description SPUTUM  Final   Special Requests NONE  Final   Sputum evaluation   Final    MICROSCOPIC FINDINGS SUGGEST THAT THIS SPECIMEN IS NOT REPRESENTATIVE OF LOWER RESPIRATORY SECRETIONS. PLEASE RECOLLECT. Gram Stain Report Called to,Read Back By and Verified With: Debroah LoopKATIE DUNKELBERGER RN 6.23.17 @ 1605 BY RICEJ    Report Status 11/08/2015 FINAL  Final     Radiology Studies: No results found.   Scheduled Meds: .  albuterol  2.5 mg Nebulization Q4H  . benzonatate  200 mg Oral TID  . budesonide (PULMICORT) nebulizer solution  0.5 mg Nebulization BID  . dextromethorphan-guaiFENesin  1 tablet Oral BID  . enoxaparin (LOVENOX) injection  0.5 mg/kg Subcutaneous Q24H  . fluticasone  1 spray Each Nare BID  . furosemide  40 mg Oral Daily  . levofloxacin  750 mg Oral Daily  . loratadine  10 mg Oral Daily  . [START ON 11/15/2015] methylPREDNISolone (SOLU-MEDROL) injection  60 mg Intravenous Q24H  . montelukast  10 mg Oral QHS  . nystatin  5 mL Oral QID  . olopatadine  1 drop Both Eyes BID  . pantoprazole  40 mg Oral Daily  . polyethylene glycol  17 g Oral Daily   Continuous Infusions: . albuterol 5 mg/hr (11/08/15 2022)     LOS: 7 days    Time spent: 25 minutes    Vassie LollMadera, Bertil Brickey, MD [facility] Pager [phone]  If 7PM-7AM, please contact night-coverage [email or web address] Password [email or web address] 11/14/2015, 12:07 PM

## 2015-11-14 NOTE — Progress Notes (Signed)
Name: Carrie [name] MRN: [id number] DOB: 01/14/1996    ADMISSION DATE:  11/06/2015 CONSULTATION DATE:  11/12/15  REFERRING MD :  Dr. Sunnie Nielsenegalado / [facility]   CHIEF COMPLAINT:  Asthma Exacerbation   HISTORY OF PRESENT ILLNESS:  20 y/o F, Je[employer]'s Witness, with a PMH of migraines, morbid obesity, fibromyalgia, pseudotumor cerebri, and asthma who [facility] with reports of a three-day history of increased wheezing and shortness of breath.  The patient had been seen by her primary care physician prior to presentation and was treated with prednisone. She also had tried home nebulizers without significant relief of symptoms.  The patient also reported a cough with productive yellow / brown sputum. She reports she has not been taking Qvar for well over a year. She also has been on and off antibiotics for her wisdom teeth which have yet to be removed.  At baseline she uses albuterol 1-2 times per month.    Initial data reviewed - WBC 10.9, K3.3, normal serum creatinine, room air saturations of 98% and chest x-ray negative for acute infiltrate.  [facility]. She was treated with nebulized bronchodilators, Singulair, IV steroids, oral azithromycin & cough suppression.  The patient made slow progress with improvement. Given concerns for slow resolution a CT of the chest was evaluated which demonstrated an infiltrate in the left lower lobe, negative for PE. She completed 5 days of azithromycin.    PCCM consulted for slow to resolve asthma exacerbation, LLL infiltrate.     SUBJECTIVE:   VITAL SIGNS: Temp:  [98.1 F (36.7 C)-99.6 F (37.6 C)] 99.6 F (37.6 C) (06/29 0448) Pulse Rate:  [81-95] 87 (06/29 0448) Resp:  [18-20] 20 (06/29 0448) BP: (127-141)/(80-87) 131/80 mmHg (06/29 0448) SpO2:  [95 %-97 %] 95 % (06/29 0757)  PHYSICAL EXAMINATION: General:  Morbidly obese female in NAD . CO coughing with deep breath Neuro:  AAOx4, speech clear, MAE    HEENT:  Moist mucus membranes, thrush noted Cardiovascular:  S1, S2, RRR, No MRG Lungs:  Even/non-labored, B/L rhonchi, improved wheezing, pain with deep breath Abdomen:  Obese/soft, + BS Musculoskeletal:  No acute deformities Skin:  Warm/dry, no edema    Recent Labs Lab 11/09/15 0434 11/10/15 0521 11/11/15 0436  NA 137 137 135  K 4.2 4.2 4.3  CL 102 100* 100*  CO2 27 29 28   BUN 13 16 18   CREATININE 0.53 0.67 0.60  GLUCOSE 135* 126* 125*    Recent Labs Lab 11/11/15 0436 11/12/15 1128 11/13/15 0520  HGB 11.0* 11.7* 10.9*  HCT 33.8* 35.3* 33.3*  WBC 14.8* 19.2* 16.9*  PLT 342 354 312   No results found. SIGNIFICANT EVENTS  6/21  Admit with acute asthma exacerbation  STUDIES:  6/25  CTA Chest >> negative for PE, no lymphadenopathy, consolidation with air bronchograms in the left lower lobe posterior medially, no pulmonary edema or pneumothorax  ABX:  Azithromycin 6/21 >> 6/25  Rocephin 6/26 >> 6/27 Vanco 6/27 >> Zosyn 6/27 >>   CULTURES: RVP 6/21 >> negative  Sputum 6/23 >> not lower specimen  BCx2 6/21 >> negative   ASSESSMENT / PLAN: Acute Asthma Exacerbation - She has been off maintenance medications > 4639yr, RVP negative on admit.  Oral thrush  Plan: DC pulmicort while on steroids Albuterol Q4 & Q2 PRN wheezing  Tessalon, Tussionex for cough  Continue Singulair, Flonase, Claritin  Change to PO prednisone 60 mg with slow taper Nystatin ordered  LLL PNA - no infiltrate on CXR on admit, has been On/Off abx for wisdom teeth swelling with concerns for infection  Plan: On vancomycin / zosyn for nosocomial coverage.  Would change to Levaquin for total 10 days abx coverage. Decrease steroids to 60 mg daily with slow taper over 16 days. Stop Qvar while on prednisone to allow thrush to heal. Resume prior to steroid taper completion.  Good hygiene emphasized.   Monitor CBC, fever curve Flutter valve encouraged Mobilize, OOB / pulmonary hygiene   Should  be able to dc in 24 hours  Brett CanalesSteve Minor ACNP [facility] Pager [phone] till 3 pm If no answer page [phone] 11/14/2015, 11:07 AM    Attending note: I have seen and examined the patient with nurse practitioner/resident and agree with the note. History, labs and imaging reviewed.  19 Y/O with morbid obesity, fibromyalgia, pseudotumor cerebri, child hood asthma presents with dyspnea, wheezing CTA reviewed > it shows no PE but LLL consolidation. Low Pct is reassuring  Reccs: OK to switch abx to levaquin Start steroid taper.  Nystatin for oral thrush  Rest of plan as above. She will follow up in clinic as outpatient.  I will make the appointment.  Chilton GreathousePraveen Maitland Lesiak MD [facility] Pager [phone] If no answer or after 3pm call: [phone] 11/14/2015, 1:01 PM

## 2015-11-15 LAB — CBC
HEMATOCRIT: 33.6 % — AB (ref 36.0–46.0)
HEMOGLOBIN: 11.1 g/dL — AB (ref 12.0–15.0)
MCH: 27.5 pg (ref 26.0–34.0)
MCHC: 33 g/dL (ref 30.0–36.0)
MCV: 83.2 fL (ref 78.0–100.0)
PLATELETS: 286 10*3/uL (ref 150–400)
RBC: 4.04 MIL/uL (ref 3.87–5.11)
RDW: 15.8 % — ABNORMAL HIGH (ref 11.5–15.5)
WBC: 17.1 10*3/uL — AB (ref 4.0–10.5)

## 2015-11-15 LAB — BASIC METABOLIC PANEL
ANION GAP: 6 (ref 5–15)
BUN: 18 mg/dL (ref 6–20)
CALCIUM: 8.4 mg/dL — AB (ref 8.9–10.3)
CHLORIDE: 102 mmol/L (ref 101–111)
CO2: 28 mmol/L (ref 22–32)
Creatinine, Ser: 0.67 mg/dL (ref 0.44–1.00)
GFR calc Af Amer: >60 mL/min (ref >60)
GFR calc non Af Amer: >60 mL/min (ref >60)
GLUCOSE: 88 mg/dL (ref 65–99)
POTASSIUM: 4.1 mmol/L (ref 3.5–5.1)
Sodium: 136 mmol/L (ref 135–145)

## 2015-11-15 MED ORDER — FLUTICASONE PROPIONATE 50 MCG/ACT NA SUSP: 1.0000 | Freq: Every day | NASAL | Status: AC

## 2015-11-15 MED ORDER — MONTELUKAST SODIUM 10 MG PO TABS
10.0000 mg | ORAL_TABLET | Freq: Every day | ORAL | Status: AC
Start: 2015-11-15 — End: ?

## 2015-11-15 MED ORDER — NYSTATIN 100000 UNIT/ML MT SUSP: 5.0000 mL | Freq: Four times a day (QID) | OROMUCOSAL | Status: DC

## 2015-11-15 MED ORDER — PREDNISONE 20 MG PO TABS: ORAL_TABLET | ORAL | Status: DC

## 2015-11-15 MED ORDER — FUROSEMIDE 40 MG PO TABS: 40.0000 mg | ORAL_TABLET | Freq: Every day | ORAL | Status: AC

## 2015-11-15 MED ORDER — LEVOFLOXACIN 750 MG PO TABS: 750.0000 mg | ORAL_TABLET | Freq: Every day | ORAL | Status: AC

## 2015-11-15 MED ORDER — TRAMADOL HCL 50 MG PO TABS: 100.0000 mg | ORAL_TABLET | Freq: Three times a day (TID) | ORAL | Status: AC | PRN

## 2015-11-15 MED ORDER — SACCHAROMYCES BOULARDII 250 MG PO CAPS: 250.0000 mg | ORAL_CAPSULE | Freq: Two times a day (BID) | ORAL | Status: DC

## 2015-11-15 MED ORDER — PANTOPRAZOLE SODIUM 40 MG PO TBEC
40.0000 mg | DELAYED_RELEASE_TABLET | Freq: Every day | ORAL | Status: AC
Start: 2015-11-15 — End: ?

## 2015-11-15 MED ORDER — FLUTICASONE-SALMETEROL 250-50 MCG/DOSE IN AEPB: 1.0000 | INHALATION_SPRAY | Freq: Two times a day (BID) | RESPIRATORY_TRACT | Status: DC

## 2015-11-15 MED ORDER — ALBUTEROL SULFATE (2.5 MG/3ML) 0.083% IN NEBU: 2.5000 mg | INHALATION_SOLUTION | Freq: Four times a day (QID) | RESPIRATORY_TRACT | Status: AC | PRN

## 2015-11-15 MED ORDER — BENZONATATE 200 MG PO CAPS: 200.0000 mg | ORAL_CAPSULE | Freq: Three times a day (TID) | ORAL | Status: DC | PRN

## 2015-11-15 MED ORDER — ALBUTEROL SULFATE HFA 108 (90 BASE) MCG/ACT IN AERS: 2.0000 | INHALATION_SPRAY | Freq: Four times a day (QID) | RESPIRATORY_TRACT | Status: AC | PRN

## 2015-11-15 NOTE — Discharge Summary (Signed)
Physician Discharge Summary  Carmin MuskratShaunja D Ederer ZOX:[id number] DOB: 03/15/1996 DOA: 11/06/2015  PCP: Lyda PeroneEES,JANET L, MD  Admit date: 11/06/2015 Discharge date: 11/15/2015  Time spent: 35 minutes  Recommendations for Outpatient Follow-up:  Repeat BMET to follow electrolytes and renal function Repeat CBC follow WBC's trend  Discharge Diagnoses:  Principal Problem:   Asthma exacerbation Active Problems:   Pseudotumor cerebri   Migraine   Morbid obesity (HCC)   Depression   Hypokalemia   LLL pneumonia   SOB (shortness of breath)   Discharge Condition: stable and improved. Discharge home with instructions to follow up with PCP in 2 weeks and with pulmonary service as instructed.  Diet recommendation: low calorie diet   Filed Weights   11/06/15 1636  Weight: 135.2 kg (298 lb 1 oz)    History of present illness:  HPI on 11/06/2015 by Dr. Ronnald CollumXilin Niu Khalis D Stegmaier is a 20 y.o. female with medical history significant of asthma, depression, migraine headache and pseudotumor tumor cer tumor cerebri, fibromyalgia, who presents, wheezing and SOB. Patient reports that she has been having cough, wheezing and shortness of breath for several days, which has progressively getting worse. She was seen by her PCP 3 days ago, and given prescription for prednisone. She has been using inhaler and prednisone without any improvement. Patient coughs up brownish colored sputum, no chest pain, ffever or chills. Her shortness of breath has been progressively getting worse. She can still speak full sentences. Patient does not have vomiting, diarrhea, symptoms of UTI or unilateral weakness. She has mild nausea.   Hospital Course:  Asthma exacerbation with LLL pneumonia - CT scan of the chest showed no PE, but an infiltrate in the left lower lobe. Interestingly, on exam today, she does have some rhonchi on that side now that wheezing is all but gone. She also has ongoing pleuritic chest pain which has been  mostly restricted to that area.  -Continue flutter valve -at discharge will use Advair -Will need PFT's and further adjustment to maintenance therapy at discharge -per pulmonary recommendations will discharge on levaquin and complete a total of 10 days. Images suggesting PNA and unable to r/o if CAP vs HCAP (CXR on admission w/o infiltrates) -no fever and overall feeling better/breathing easier -will discharge on prednisone tapering (over 16 days), continue Advair BID, Singulair and PRN albuterol. Will follow up with pulmonary service as anoutpatient.  -will need outpatient PFT's  Pseudotumor cerebri - No longer on therapy - reports feeling fluid is building up - will resume home lasix  -outpatient follow up with her neurologist recommended    Migraine - Continue ibuprofen prn  Depression - Stable, no acute issue -reports she has taken herself off cymbalta    Hypokalemia -Resolved -will follow electrolytes intermittently and replete as needed   Morbid obesity -Body mass index is 58.21 kg/(m^2). -advise to follow low calorie diet and to increase exercise -patient motivated and planning to performed lifestyle changes -will need close follow up at discharge and if needed referral to bariatric clinic   Procedures:  See below for x-ray reports   Consultations:  Pulmonary service   Discharge Exam: Filed Vitals:   11/15/15 0017 11/15/15 0607  BP:  127/72  Pulse: 81 79  Temp:  98.3 F (36.8 C)  Resp: 18 18   General exam: Appears calm and comfortable, reports feeling a lot less SOB; but still with some coughing spells. No fever. HENT: NCAT, MMM Respiratory system: Course breath sounds on the left, rhonchi in  left lower lobe, no frank wheezing, coughing with deep breathing appreciated. Also reported pleuritic CP with coughing .  Cardiovascular system: S1 & S2 heard, RR, NR.  Gastrointestinal system: Abdomen is nondistended, soft and nontender.  Central  nervous system: Alert and oriented. No focal neurological deficits. Skin: No rashes, lesions or ulcers on extremities Psychiatry: Judgement and insight appear normal. Mood & affect appropriate.    Discharge Instructions   Discharge Instructions    Discharge instructions    Complete by:  As directed   Take medications as prescribed Please follow up with PCP in 10 days Follow up with pulmonology service (office will arrange visit for you) Follow low calorie diet and increase exercise activity  Maintain adequate hydration          Current Discharge Medication List    START taking these medications   Details  benzonatate (TESSALON) 200 MG capsule Take 1 capsule (200 mg total) by mouth 3 (three) times daily as needed for cough. Qty: 20 capsule, Refills: 0    Fluticasone-Salmeterol (ADVAIR DISKUS) 250-50 MCG/DOSE AEPB Inhale 1 puff into the lungs 2 (two) times daily. Qty: 60 each, Refills: 0    levofloxacin (LEVAQUIN) 750 MG tablet Take 1 tablet (750 mg total) by mouth daily. Qty: 6 tablet, Refills: 0    nystatin (MYCOSTATIN) 100000 UNIT/ML suspension Take 5 mLs (500,000 Units total) by mouth 4 (four) times daily. Qty: 60 mL, Refills: 0    pantoprazole (PROTONIX) 40 MG tablet Take 1 tablet (40 mg total) by mouth daily. Qty: 30 tablet, Refills: 1    saccharomyces boulardii (FLORASTOR) 250 MG capsule Take 1 capsule (250 mg total) by mouth 2 (two) times daily. Qty: 60 capsule, Refills: 0      CONTINUE these medications which have CHANGED   Details  albuterol (PROVENTIL HFA;VENTOLIN HFA) 108 (90 Base) MCG/ACT inhaler Inhale 2 puffs into the lungs every 6 (six) hours as needed for wheezing or shortness of breath. Qty: 1 Inhaler, Refills: 1    albuterol (PROVENTIL) (2.5 MG/3ML) 0.083% nebulizer solution Take 3 mLs (2.5 mg total) by nebulization every 6 (six) hours as needed for wheezing or shortness of breath. Qty: 75 mL, Refills: 1    fluticasone (FLONASE) 50 MCG/ACT nasal  spray Place 1 spray into both nostrils daily. Qty: 16 g, Refills: 1    furosemide (LASIX) 40 MG tablet Take 1 tablet (40 mg total) by mouth daily. Qty: 30 tablet, Refills: 0    montelukast (SINGULAIR) 10 MG tablet Take 1 tablet (10 mg total) by mouth at bedtime. Qty: 30 tablet, Refills: 0    predniSONE (DELTASONE) 20 MG tablet Take 3 tabs by mouth daily X 3 days; then 2 tablets by mouth daily X 3 days; then 1 tablet by mouth daily X 3 days; then 1/2 tablet by mouth daily X 4 days and stop prednisone. Qty: 22 tablet, Refills: 0    traMADol (ULTRAM) 50 MG tablet Take 2 tablets (100 mg total) by mouth every 8 (eight) hours as needed for severe pain. Qty: 30 tablet, Refills: 0      CONTINUE these medications which have NOT CHANGED   Details  cetirizine (ZYRTEC) 10 MG tablet Take 10 mg by mouth daily.      ibuprofen (ADVIL,MOTRIN) 800 MG tablet Take 800 mg by mouth every 6 (six) hours as needed for fever, headache, mild pain, moderate pain or cramping.    Olopatadine HCl (PATADAY) 0.2 % SOLN Place 2 drops into both eyes daily.  STOP taking these medications     beclomethasone (QVAR) 40 MCG/ACT inhaler      DM-Doxylamine-Acetaminophen (VICKS NYQUIL COLD & FLU) 15-6.25-325 MG/15ML LIQD      acetaZOLAMIDE (DIAMOX) 250 MG tablet      DULoxetine (CYMBALTA) 20 MG capsule        Allergies  Allergen Reactions  . Kiwi Extract Itching    Tongue itching   . Pineapple Itching    Tongue itching   . Other     PT IS A JEHOVAH WITNESS. NO BLOOD PRODUCTS  . Percocet [Oxycodone-Acetaminophen] Other (See Comments)    vomitting and hard to arouse   Follow-up Information    Follow up with Chilton Greathouse, MD On 12/31/2015.   Specialty:  Pulmonary Disease   Why:  Follow up appt at 10:00 AM for asthma    Contact information:   [address] [place] [address] [phone]       Follow up with DEES,JANET L, MD. Schedule an appointment as soon as possible for a visit in 2  weeks.   Specialty:  Pediatrics   Contact information:   [address] [place] [address] [phone]       The results of significant diagnostics from this hospitalization (including imaging, microbiology, ancillary and laboratory) are listed below for reference.    Significant Diagnostic Studies: Dg Chest 2 View  11/08/2015  CLINICAL DATA:  Worsening short breath, dry cough EXAM: CHEST  2 VIEW COMPARISON:  None. FINDINGS: Normal mediastinum and cardiac silhouette. Normal pulmonary vasculature. No evidence of effusion, infiltrate, or pneumothorax. No acute bony abnormality. IMPRESSION: No acute cardiopulmonary process. Electronically Signed   By: Genevive Bi M.D.   On: 11/08/2015 21:17   Dg Chest 2 View  11/06/2015  CLINICAL DATA:  Increasing cough, congestion and shortness of breath over the past 4 days. EXAM: CHEST  2 VIEW COMPARISON:  PA and lateral chest 05/21/2011 and 03/03/2010. FINDINGS: The lungs are clear. Heart size is normal. No pneumothorax or pleural effusion. No bony abnormality. IMPRESSION: Negative chest. Electronically Signed   By: Drusilla Kanner M.D.   On: 11/06/2015 11:35   Ct Angio Chest Pe W Or Wo Contrast  11/10/2015  CLINICAL DATA:  Shortness of breath, tachycardia, chest pain EXAM: CT ANGIOGRAPHY CHEST WITH CONTRAST TECHNIQUE: Multidetector CT imaging of the chest was performed using the standard protocol during bolus administration of intravenous contrast. Multiplanar CT image reconstructions and MIPs were obtained to evaluate the vascular anatomy. CONTRAST:  100 cc Isovue COMPARISON:  None. FINDINGS: Cardiovascular: There are quantum mottle artifacts from patient's large body habitus. Heart size within normal limits. No pericardial effusion. No aortic aneurysm. No pulmonary embolus is noted. No aortic dissection. Mediastinum: No mediastinal hematoma or adenopathy. No hilar adenopathy. Lungs/Pleura: Axial image 72 there is consolidation with some air  bronchogram in left lower lobe posterior medially. This is confirmed on sagittal image 92. This is highly suspicious for infiltrate/pneumonia. There is no pulmonary edema. Minimal linear atelectasis or scarring noted in right lower lobe posterior medially. Otherwise the right lung is clear. No pulmonary nodules are noted. No pneumothorax. Upper Abdomen: Visualized upper abdomen shows mild fatty infiltration of the liver. Musculoskeletal: No destructive bony lesions are noted. Sagittal images of the spine are unremarkable. Review of the MIP images confirms the above findings. IMPRESSION: 1. No pulmonary embolus is noted. No mediastinal hematoma or adenopathy. 2. There is consolidation with some air bronchogram in left lower lobe posterior medially. This  is highly suspicious for infiltrate/pneumonia. 3. No pulmonary edema.  No pneumothorax. Electronically Signed   By: Natasha Mead M.D.   On: 11/10/2015 17:22    Microbiology: Recent Results (from the past 240 hour(s))  Culture, blood (routine x 2) Call MD if unable to obtain prior to antibiotics being given     Status: None   Collection Time: 11/06/15  3:21 PM  Result Value Ref Range Status   Specimen Description BLOOD LEFT ANTECUBITAL  Final   Special Requests BOTTLES DRAWN AEROBIC AND ANAEROBIC 5 ML  Final   Culture   Final    NO GROWTH 5 DAYS Performed at [facility]    Report Status 11/11/2015 FINAL  Final  Culture, blood (routine x 2) Call MD if unable to obtain prior to antibiotics being given     Status: None   Collection Time: 11/06/15  3:26 PM  Result Value Ref Range Status   Specimen Description BLOOD RIGHT HAND  Final   Special Requests BOTTLES DRAWN AEROBIC AND ANAEROBIC 5 ML  Final   Culture   Final    NO GROWTH 5 DAYS Performed at [facility]    Report Status 11/11/2015 FINAL  Final  Respiratory Panel by PCR     Status: None   Collection Time: 11/06/15  5:42 PM  Result Value Ref Range Status   Adenovirus NOT  DETECTED NOT DETECTED Final   Coronavirus 229E NOT DETECTED NOT DETECTED Final   Coronavirus HKU1 NOT DETECTED NOT DETECTED Final   Coronavirus NL63 NOT DETECTED NOT DETECTED Final   Coronavirus OC43 NOT DETECTED NOT DETECTED Final   Metapneumovirus NOT DETECTED NOT DETECTED Final   Rhinovirus / Enterovirus NOT DETECTED NOT DETECTED Final   Influenza A NOT DETECTED NOT DETECTED Final   Influenza A H1 NOT DETECTED NOT DETECTED Final   Influenza A H1 2009 NOT DETECTED NOT DETECTED Final   Influenza A H3 NOT DETECTED NOT DETECTED Final   Influenza B NOT DETECTED NOT DETECTED Final   Parainfluenza Virus 1 NOT DETECTED NOT DETECTED Final   Parainfluenza Virus 2 NOT DETECTED NOT DETECTED Final   Parainfluenza Virus 3 NOT DETECTED NOT DETECTED Final   Parainfluenza Virus 4 NOT DETECTED NOT DETECTED Final   Respiratory Syncytial Virus NOT DETECTED NOT DETECTED Final   Bordetella pertussis NOT DETECTED NOT DETECTED Final   Chlamydophila pneumoniae NOT DETECTED NOT DETECTED Final   Mycoplasma pneumoniae NOT DETECTED NOT DETECTED Final  Culture, sputum-assessment     Status: None   Collection Time: 11/08/15  3:16 PM  Result Value Ref Range Status   Specimen Description SPUTUM  Final   Special Requests NONE  Final   Sputum evaluation   Final    MICROSCOPIC FINDINGS SUGGEST THAT THIS SPECIMEN IS NOT REPRESENTATIVE OF LOWER RESPIRATORY SECRETIONS. PLEASE RECOLLECT. Gram Stain Report Called to,Read Back By and Verified With: Debroah Loop RN 6.23.17 @ 1605 BY RICEJ    Report Status 11/08/2015 FINAL  Final     Labs: Basic Metabolic Panel:  Recent Labs Lab 11/09/15 0434 11/10/15 0521 11/11/15 0436 11/15/15 0523  NA 137 137 135 136  K 4.2 4.2 4.3 4.1  CL 102 100* 100* 102  CO2 27 29 28 28   GLUCOSE 135* 126* 125* 88  BUN 13 16 18 18   CREATININE 0.53 0.67 0.60 0.67  CALCIUM 9.0 9.0 8.7* 8.4*   CBC:  Recent Labs Lab 11/10/15 0521 11/11/15 0436 11/12/15 1128 11/13/15 0520  11/15/15 0523  WBC  15.6* 14.8* 19.2* 16.9* 17.1*  HGB 11.2* 11.0* 11.7* 10.9* 11.1*  HCT 34.6* 33.8* 35.3* 33.3* 33.6*  MCV 82.6 82.2 81.9 81.0 83.2  PLT 342 342 354 312 286   Cardiac Enzymes:  Recent Labs Lab 11/09/15 1845  TROPONINI <0.03    Signed:  Vassie Loll MD.  [facility] 11/15/2015, 11:40 AM

## 2015-12-31 ENCOUNTER — Other Ambulatory Visit (INDEPENDENT_AMBULATORY_CARE_PROVIDER_SITE_OTHER): Payer: Medicaid Other

## 2015-12-31 ENCOUNTER — Ambulatory Visit (INDEPENDENT_AMBULATORY_CARE_PROVIDER_SITE_OTHER): Payer: Medicaid Other | Admitting: Pulmonary Disease

## 2015-12-31 ENCOUNTER — Encounter: Payer: Self-pay | Admitting: Pulmonary Disease

## 2015-12-31 VITALS — BP 114/74 | HR 75 | Ht 60.5 in | Wt 301.0 lb

## 2015-12-31 DIAGNOSIS — J4540 Moderate persistent asthma, uncomplicated: Secondary | ICD-10-CM | POA: Diagnosis not present

## 2015-12-31 LAB — CBC WITH DIFFERENTIAL/PLATELET
BASOS PCT: 0.4 % (ref 0.0–3.0)
Basophils Absolute: 0 10*3/uL (ref 0.0–0.1)
EOS PCT: 0.1 % (ref 0.0–5.0)
Eosinophils Absolute: 0 10*3/uL (ref 0.0–0.7)
HCT: 33.2 % — ABNORMAL LOW (ref 36.0–46.0)
HEMOGLOBIN: 10.9 g/dL — AB (ref 12.0–15.0)
LYMPHS ABS: 2.1 10*3/uL (ref 0.7–4.0)
Lymphocytes Relative: 16.2 % (ref 12.0–46.0)
MCHC: 32.9 g/dL (ref 30.0–36.0)
MCV: 81.7 fl (ref 78.0–100.0)
MONOS PCT: 6.8 % (ref 3.0–12.0)
Monocytes Absolute: 0.9 10*3/uL (ref 0.1–1.0)
Neutro Abs: 9.9 10*3/uL — ABNORMAL HIGH (ref 1.4–7.7)
Neutrophils Relative %: 76.5 % (ref 43.0–77.0)
Platelets: 288 10*3/uL (ref 150.0–400.0)
RBC: 4.06 Mil/uL (ref 3.87–5.11)
RDW: 16.6 % — AB (ref 11.5–14.6)
WBC: 13 10*3/uL — AB (ref 4.5–10.5)

## 2015-12-31 MED ORDER — FLUTICASONE-SALMETEROL 250-50 MCG/DOSE IN AEPB: 1.0000 | INHALATION_SPRAY | Freq: Two times a day (BID) | RESPIRATORY_TRACT | 0 refills | Status: AC

## 2015-12-31 NOTE — Progress Notes (Signed)
Carrie MuskratShaunja D Paci    [id number]    08/09/1995  Primary Care Physician:BRISCOE, Selena BattenKIM, MD  Referring Physician: Chales SalmonJanet Dees, MD [address] [place] [id number]  Chief complaint:  Follow up after recent hospitalization for asthma exacerbation with LLL pneumonia  HPI: 20 y/o F, Jehovah's Witness, with a PMH of migraines, morbid obesity, fibromyalgia, pseudotumor cerebri, and asthma who was admitted [facility] from 6/21-6/30/17 with asthma exacerbation in the setting of left lower lobe CAP.  She has been diagnosed with childhood asthma but she reports she has not been taking Qvar for well over a year.  At baseline she uses albuterol 1-2 times per month. She has issues with seasonal allergies, allergic rhinitis, postnasal drip.    Outpatient Encounter Prescriptions as of 12/31/2015  Medication Sig  . albuterol (PROVENTIL HFA;VENTOLIN HFA) 108 (90 Base) MCG/ACT inhaler Inhale 2 puffs into the lungs every 6 (six) hours as needed for wheezing or shortness of breath.  Marland Kitchen. albuterol (PROVENTIL) (2.5 MG/3ML) 0.083% nebulizer solution Take 3 mLs (2.5 mg total) by nebulization every 6 (six) hours as needed for wheezing or shortness of breath.  . benzonatate (TESSALON) 200 MG capsule Take 1 capsule (200 mg total) by mouth 3 (three) times daily as needed for cough.  . cetirizine (ZYRTEC) 10 MG tablet Take 10 mg by mouth daily.    . fluticasone (FLONASE) 50 MCG/ACT nasal spray Place 1 spray into both nostrils daily.  . Fluticasone-Salmeterol (ADVAIR DISKUS) 250-50 MCG/DOSE AEPB Inhale 1 puff into the lungs 2 (two) times daily.  . furosemide (LASIX) 40 MG tablet Take 1 tablet (40 mg total) by mouth daily.  Marland Kitchen. ibuprofen (ADVIL,MOTRIN) 800 MG tablet Take 800 mg by mouth every 6 (six) hours as needed for fever, headache, mild pain, moderate pain or cramping.  . montelukast (SINGULAIR) 10 MG tablet Take 1 tablet (10 mg total) by mouth at bedtime.  Marland Kitchen. nystatin (MYCOSTATIN) 100000  UNIT/ML suspension Take 5 mLs (500,000 Units total) by mouth 4 (four) times daily.  . Olopatadine HCl (PATADAY) 0.2 % SOLN Place 2 drops into both eyes daily.  . pantoprazole (PROTONIX) 40 MG tablet Take 1 tablet (40 mg total) by mouth daily.  . predniSONE (DELTASONE) 20 MG tablet Take 3 tabs by mouth daily X 3 days; then 2 tablets by mouth daily X 3 days; then 1 tablet by mouth daily X 3 days; then 1/2 tablet by mouth daily X 4 days and stop prednisone.  . saccharomyces boulardii (FLORASTOR) 250 MG capsule Take 1 capsule (250 mg total) by mouth 2 (two) times daily.  . traMADol (ULTRAM) 50 MG tablet Take 2 tablets (100 mg total) by mouth every 8 (eight) hours as needed for severe pain.   No facility-administered encounter medications on file as of 12/31/2015.     Allergies as of 12/31/2015 - Review Complete 11/06/2015  Allergen Reaction Noted  . Kiwi extract Itching 11/06/2015  . Pineapple Itching 11/06/2015  . Other  11/06/2015  . Percocet [oxycodone-acetaminophen] Other (See Comments) 03/27/2011    Past Medical History:  Diagnosis Date  . Asthma   . Fibromyalgia   . Migraines   . Pseudotumor cerebri     No past surgical history on file.  Family History  Problem Relation Age of Onset  . Migraines Mother   . Thyroid cancer Mother   . Fibromyalgia Mother   . Migraines Maternal Aunt   . Stroke Maternal Grandmother   .  Other Cousin     Myasthenia gravis    Social History   Social History  . Marital status: Single    Spouse name: N/A  . Number of children: N/A  . Years of education: N/A   Occupational History  . Not on file.   Social History Main Topics  . Smoking status: Never Smoker  . Smokeless tobacco: Not on file  . Alcohol use Not on file  . Drug use: Unknown  . Sexual activity: Not on file   Other Topics Concern  . Not on file   Social History Narrative  . No narrative on file     Review of systems: Review of Systems  Constitutional: Negative for  fever and chills.  HENT: Negative.   Eyes: Negative for blurred vision.  Respiratory: as per HPI  Cardiovascular: Negative for chest pain and palpitations.  Gastrointestinal: Negative for vomiting, diarrhea, blood per rectum. Genitourinary: Negative for dysuria, urgency, frequency and hematuria.  Musculoskeletal: Negative for myalgias, back pain and joint pain.  Skin: Negative for itching and rash.  Neurological: Negative for dizziness, tremors, focal weakness, seizures and loss of consciousness.  Endo/Heme/Allergies: Negative for environmental allergies.  Psychiatric/Behavioral: Negative for depression, suicidal ideas and hallucinations.  All other systems reviewed and are negative.   Physical Exam: There were no vitals taken for this visit. Gen:      No acute distress HEENT:  EOMI, sclera anicteric Neck:     No masses; no thyromegaly Lungs:    Clear to auscultation bilaterally; normal respiratory effort CV:         Regular rate and rhythm; no murmurs Abd:      + bowel sounds; soft, non-tender; no palpable masses, no distension Ext:    No edema; adequate peripheral perfusion Skin:      Warm and dry; no rash Neuro: alert and oriented x 3 Psych: normal mood and affect  Data Reviewed: CTA 11/11/15 No pulmonary embolism, left lower lobe consolidation. Images personally reviewed.  Assessment:  Asthma with recent exacerbation  She seems to be doing well with no symptoms at present. She is not using the Advair since [facility] We'll give her a few samples from the office today and try to determine which long-term controller medication is covered by her insurance.  I'll get blood work for CBC with differential, blood allergy panel. She recently had her wisdom tooth removed so we will be unable to cooperate with pulmonary function tests. She'll be scheduled for this at the time of next visit along with an FeNo  Plan/Recommendations: - Continue advair  250/50 - Check CBC with diff, Allergy panel - PFTs and FeNO at next visit.  Chilton GreathousePraveen Pinky Ravan MD  Pulmonary and Critical Care Pager [phone] 12/31/2015, 10:26 AM  CC: Chales Salmonees, Janet, MD

## 2015-12-31 NOTE — Patient Instructions (Addendum)
Please continue using the Advair 250/50. We'll check with insurance to see how we can get it covered with this inhaler. We will check a CBC with differential and blood allergy panel today. We will get pulmonary function tests and an FENO at the time of next visit.  Return in 3 months.

## 2015-12-31 NOTE — Addendum Note (Signed)
Addended by: Boone MasterJONES, Zaedyn Covin E on: 12/31/2015 11:18 AM   Modules accepted: Orders

## 2015-12-31 NOTE — Addendum Note (Signed)
Addended by: Tommie SamsSILVA, MINDY S on: 12/31/2015 10:51 AM   Modules accepted: Orders

## 2016-01-01 ENCOUNTER — Telehealth: Payer: Self-pay | Admitting: Pulmonary Disease

## 2016-01-01 LAB — RESPIRATORY ALLERGY PROFILE REGION II ~~LOC~~
ALLERGEN, D PTERNOYSSINUS, D1: 8.37 kU/L — AB
ALLERGEN, MOUSE U PROTEIN, E72: 0.43 kU/L — AB
ALLERGEN, OAK, T7: 2.49 kU/L — AB
Allergen, A. alternata, m6: 1.29 kU/L — ABNORMAL HIGH
Allergen, C. Herbarum, M2: 1.46 kU/L — ABNORMAL HIGH
Allergen, Cedar tree, t12: 0.88 kU/L — ABNORMAL HIGH
Allergen, Comm Silver Birch, t9: 0.94 kU/L — ABNORMAL HIGH
Allergen, Cottonwood, t14: 1.12 kU/L — ABNORMAL HIGH
Allergen, Mulberry, t76: 0.57 kU/L — ABNORMAL HIGH
Allergen, P. notatum, m1: 3.32 kU/L — ABNORMAL HIGH
Aspergillus fumigatus, m3: 6.71 kU/L — ABNORMAL HIGH
BOX ELDER: 1.07 kU/L — AB
Bermuda Grass: 23.8 kU/L — ABNORMAL HIGH
CAT DANDER: 2.91 kU/L — AB
Cockroach: 4.26 kU/L — ABNORMAL HIGH
Common Ragweed: 0.35 kU/L — ABNORMAL HIGH
D. FARINAE: 6.67 kU/L — AB
DOG DANDER: 54.2 kU/L — AB
ELM IGE: 0.9 kU/L — AB
IgE (Immunoglobulin E), Serum: 955 kU/L — ABNORMAL HIGH (ref <115)
JOHNSON GRASS: 22.4 kU/L — AB
Pecan/Hickory Tree IgE: 1 kU/L — ABNORMAL HIGH
Rough Pigweed  IgE: 0.64 kU/L — ABNORMAL HIGH
SHEEP SORREL IGE: 0.84 kU/L — AB
Timothy Grass: 40.7 kU/L — ABNORMAL HIGH

## 2016-01-01 NOTE — Telephone Encounter (Signed)
We can try Qvar 80 mcg bid first. Please order.

## 2016-01-01 NOTE — Telephone Encounter (Signed)
Pt seen yesterday 8.15.17 for HFU and had reported at time of ov that her Advair is covered by insurance - she is Medicaid  Dr Isaiah SergeMannam, I called [facility] and was informed that pt must try and fail QVAR before Advair is covered, unless we do a PA.  Which would you prefer?  Thanks.

## 2016-01-07 MED ORDER — BECLOMETHASONE DIPROPIONATE 80 MCG/ACT IN AERS: 2.0000 | INHALATION_SPRAY | Freq: Two times a day (BID) | RESPIRATORY_TRACT | 6 refills | Status: AC

## 2016-01-07 NOTE — Telephone Encounter (Signed)
JJ not in office. forwarding back to triage as I had done so earlier.

## 2016-01-07 NOTE — Telephone Encounter (Signed)
Pt aware of rx change.  rx sent to approved pharmacy.  Nothing further needed.

## 2016-03-30 ENCOUNTER — Ambulatory Visit: Payer: Medicaid Other | Admitting: Pulmonary Disease

## 2017-05-04 ENCOUNTER — Telehealth: Payer: Self-pay | Admitting: *Deleted

## 2017-05-04 ENCOUNTER — Ambulatory Visit: Payer: Medicaid Other | Admitting: Neurology

## 2017-05-04 NOTE — Telephone Encounter (Signed)
No showed new patient appointment. 

## 2017-05-05 ENCOUNTER — Encounter: Payer: Self-pay | Admitting: Neurology

## 2017-07-09 ENCOUNTER — Encounter: Payer: Self-pay | Admitting: *Deleted

## 2017-07-12 ENCOUNTER — Encounter: Payer: Self-pay | Admitting: *Deleted

## 2017-07-12 ENCOUNTER — Ambulatory Visit: Payer: Medicaid Other | Admitting: Neurology

## 2017-07-13 ENCOUNTER — Encounter: Payer: Self-pay | Admitting: Diagnostic Neuroimaging

## 2017-07-13 ENCOUNTER — Ambulatory Visit: Payer: Medicaid Other | Admitting: Diagnostic Neuroimaging

## 2017-07-13 VITALS — BP 120/74 | HR 97 | Ht 60.5 in | Wt 330.2 lb

## 2017-07-13 DIAGNOSIS — G932 Benign intracranial hypertension: Secondary | ICD-10-CM

## 2017-07-13 MED ORDER — TOPIRAMATE 50 MG PO TABS: 50.0000 mg | ORAL_TABLET | Freq: Two times a day (BID) | ORAL | 12 refills | Status: AC

## 2017-07-13 NOTE — Progress Notes (Signed)
[facility]  PATIENT: Carrie [name] DOB: 08-26-1995  REFERRING CLINICIAN: A Ervin HISTORY FROM: patient and mother  REASON FOR VISIT: new consult    HISTORICAL  CHIEF COMPLAINT:  Chief Complaint  Patient presents with  . Pseudotumor cerebri    rm 7, New Pt, mom- Carrie [name]  . fibromyalgia    HISTORY OF PRESENT ILLNESS:   22 year old female here for evaluation of pseudotumor cerebri.  Patient has history of headaches since age 7 years old.  At age 74 years old she had eye exam showing papilledema and this was followed up with lumbar puncture which showed elevated opening pressure of 24.  Patient was tried on topiramate but this caused some numbness and tingling side effect.  She was then started on Lasix.  She was then lost to follow-up.  Patient has not seen pediatric neurology or ophthalmology in several years.  Nowadays patient does not have much headaches.  She does have blurred vision.  Patient also has a prior history of migraine headaches, myofascial pain, fibromyalgia, obesity.    REVIEW OF SYSTEMS: Full 14 system review of systems performed and negative with exception of: Blurred vision headache weakness joint pain aching muscles.  ALLERGIES: Allergies  Allergen Reactions  . Kiwi Extract Itching    Tongue itching   . Pineapple Itching    Tongue itching   . Other     PT IS A JEHOVAH WITNESS. NO BLOOD PRODUCTS  . Percocet [Oxycodone-Acetaminophen] Other (See Comments)    vomitting and hard to arouse    HOME MEDICATIONS: Outpatient Medications Prior to Visit  Medication Sig Dispense Refill  . albuterol (PROVENTIL HFA;VENTOLIN HFA) 108 (90 Base) MCG/ACT inhaler Inhale 2 puffs into the lungs every 6 (six) hours as needed for wheezing or shortness of breath. 1 Inhaler 1  . albuterol (PROVENTIL) (2.5 MG/3ML) 0.083% nebulizer solution Take 3 mLs (2.5 mg total) by nebulization every 6 (six) hours as needed for wheezing or shortness of breath. 75  mL 1  . beclomethasone (QVAR) 80 MCG/ACT inhaler Inhale 2 puffs into the lungs 2 (two) times daily. 1 Inhaler 6  . cetirizine (ZYRTEC) 10 MG tablet Take 10 mg by mouth daily.      Marland Kitchen doxycycline (VIBRA-TABS) 100 MG tablet Take by mouth.    . fluticasone (FLONASE) 50 MCG/ACT nasal spray Place 1 spray into both nostrils daily. 16 g 1  . Fluticasone-Salmeterol (ADVAIR DISKUS) 250-50 MCG/DOSE AEPB Inhale 1 puff into the lungs 2 (two) times daily. 28 each 0  . furosemide (LASIX) 40 MG tablet Take 1 tablet (40 mg total) by mouth daily. 30 tablet 0  . ibuprofen (ADVIL,MOTRIN) 800 MG tablet Take 800 mg by mouth every 6 (six) hours as needed for fever, headache, mild pain, moderate pain or cramping.    . montelukast (SINGULAIR) 10 MG tablet Take 1 tablet (10 mg total) by mouth at bedtime. 30 tablet 0  . Olopatadine HCl (PATADAY) 0.2 % SOLN Place 2 drops into both eyes daily.    . pantoprazole (PROTONIX) 40 MG tablet Take 1 tablet (40 mg total) by mouth daily. 30 tablet 1  . polyethylene glycol powder (GLYCOLAX/MIRALAX) powder Take 17 g by mouth daily.  2  . traMADol (ULTRAM) 50 MG tablet Take 2 tablets (100 mg total) by mouth every 8 (eight) hours as needed for severe pain. 30 tablet 0  . Fluticasone-Salmeterol (ADVAIR DISKUS) 250-50 MCG/DOSE AEPB Inhale 1 puff into the lungs 2 (two) times daily. (Patient not taking: Reported  on 12/31/2015) 60 each 0   No facility-administered medications prior to visit.     PAST MEDICAL HISTORY: Past Medical History:  Diagnosis Date  . Allergic rhinitis due to animal dander   . Asthma   . Carpal tunnel syndrome    bilateral  . Fibromyalgia   . GERD (gastroesophageal reflux disease)   . Migraines   . Pseudotumor cerebri     PAST SURGICAL HISTORY: Past Surgical History:  Procedure Laterality Date  . WISDOM TOOTH EXTRACTION      FAMILY HISTORY: Family History  Problem Relation Age of Onset  . Migraines Mother   . Thyroid cancer Mother   . Fibromyalgia  Mother   . Diabetes Mother   . Cancer Mother        thyroid  . Heart disease Mother   . Arthritis/Rheumatoid Mother   . Other Mother   . Hypertension Father   . Diabetes Father   . Fibromyalgia Sister   . Migraines Maternal Aunt   . Stroke Maternal Grandmother   . Hypertension Maternal Grandmother   . Diabetes Maternal Grandmother   . Heart disease Maternal Grandmother   . Hypertension Maternal Grandfather   . Diabetes Maternal Grandfather   . Heart disease Maternal Grandfather   . Hypertension Paternal Grandmother   . Diabetes Paternal Grandmother   . Hypertension Paternal Grandfather   . Diabetes Paternal Grandfather   . Other Cousin        Myasthenia gravis    SOCIAL HISTORY:  Social History   Socioeconomic History  . Marital status: Single    Spouse name: Not on file  . Number of children: Not on file  . Years of education: Not on file  . Highest education level: Not on file  Social Needs  . Financial resource strain: Not on file  . Food insecurity - worry: Not on file  . Food insecurity - inability: Not on file  . Transportation needs - medical: Not on file  . Transportation needs - non-medical: Not on file  Occupational History  . Not on file  Tobacco Use  . Smoking status: Never Smoker  . Smokeless tobacco: Never Used  Substance and Sexual Activity  . Alcohol use: Yes    Comment: occass  . Drug use: No  . Sexual activity: Not on file  Other Topics Concern  . Not on file  Social History Narrative   Lives with mother, sister   College education   Works at [employer]   No children     PHYSICAL EXAM  GENERAL EXAM/CONSTITUTIONAL: Vitals:  Vitals:   07/13/17 1554  BP: 120/74  Pulse: 97  Weight: (!) 330 lb 3.2 oz (149.8 kg)  Height: 5' 0.5" (1.537 m)     Body mass index is 63.43 kg/m.  Visual Acuity Screening   Right eye Left eye Both eyes  Without correction: 20/40 20/50   With correction:        Patient is in no distress;  well developed, nourished and groomed; neck is supple  CARDIOVASCULAR:  Examination of carotid arteries is normal; no carotid bruits  Regular rate and rhythm, no murmurs  Examination of peripheral vascular system by observation and palpation is normal  EYES:  Ophthalmoscopic exam of optic discs and posterior segments is normal; no papilledema or hemorrhages  MUSCULOSKELETAL:  Gait, strength, tone, movements noted in Neurologic exam below  NEUROLOGIC: MENTAL STATUS:  No flowsheet data found.  awake, alert, oriented to person, place and time  recent and remote memory intact  normal attention and concentration  language fluent, comprehension intact, naming intact,   fund of knowledge appropriate  CRANIAL NERVE:   2nd - no papilledema on fundoscopic exam  2nd, 3rd, 4th, 6th - pupils equal and reactive to light, visual fields full to confrontation, extraocular muscles intact, no nystagmus  5th - facial sensation symmetric  7th - facial strength symmetric  8th - hearing intact  9th - palate elevates symmetrically, uvula midline  11th - shoulder shrug symmetric  12th - tongue protrusion midline  MOTOR:   normal bulk and tone, full strength in the BUE, BLE  SENSORY:   normal and symmetric to light touch, temperature  COORDINATION:   finger-nose-finger, fine finger movements normal  REFLEXES:   deep tendon reflexes present and symmetric  GAIT/STATION:   narrow based gait    DIAGNOSTIC DATA (LABS, IMAGING, TESTING) - I reviewed patient records, labs, notes, testing and imaging myself where available.  Lab Results  Component Value Date   WBC 13.0 (H) 12/31/2015   HGB 10.9 (L) 12/31/2015   HCT 33.2 (L) 12/31/2015   MCV 81.7 12/31/2015   PLT 288.0 12/31/2015      Component Value Date/Time   NA 136 11/15/2015 0523   K 4.1 11/15/2015 0523   CL 102 11/15/2015 0523   CO2 28 11/15/2015 0523   GLUCOSE 88 11/15/2015 0523   BUN 18 11/15/2015 0523     CREATININE 0.67 11/15/2015 0523   CALCIUM 8.4 (L) 11/15/2015 0523   PROT 7.0 09/16/2009 1706   ALBUMIN 3.7 09/16/2009 1706   AST 20 09/16/2009 1706   ALT 16 09/16/2009 1706   ALKPHOS 84 09/16/2009 1706   BILITOT 0.5 09/16/2009 1706   GFRNONAA >60 11/15/2015 0523   GFRAA >60 11/15/2015 0523   No results found for: CHOL, HDL, LDLCALC, LDLDIRECT, TRIG, CHOLHDL No results found for: RUEA5W No results found for: VITAMINB12 No results found for: TSH   11/27/12 MRI brain / MRV head  - Negative exam.     ASSESSMENT AND PLAN  22 y.o. year old female here with significant obesity, here for evaluation of pseudotumor cerebri.  Dx:  1. IIH (idiopathic intracranial hypertension)      PLAN:  idiopathic intracranial hypertension (pseudotumor cerebri)  - follow up with Dr. Randon Goldsmith (ophthalmology) for monitoring of papilledema - start topiramate 50mg  at bedtime; gradually increase to 50mg  twice a day  chronic pain / obesity - recommend to optimize nutrition and physical activity - continue medical weight mgmt clinic evaluation (patient already planning to make appt with [facility] for evaluation)  Meds ordered this encounter  Medications  . topiramate (TOPAMAX) 50 MG tablet    Sig: Take 1 tablet (50 mg total) by mouth 2 (two) times daily.    Dispense:  60 tablet    Refill:  12   Return in about 4 months (around 11/10/2017).    Suanne Marker, MD 07/13/2017, 4:01 PM Certified in Neurology, Neurophysiology and Neuroimaging  [facility] [address] [place], [place] [address] [phone]

## 2017-07-13 NOTE — Patient Instructions (Signed)
idiopathic intracranial hypertension (pseudotumor cerebri)  - eye exam to monitor for optic nerve swelling - medical weight mgmt clinic evaluation - start topiramate 50mg  at bedtime; gradually increase to 50mg  twice a day  chronic pain / obesity - recommend to optimize nutrition and physical activity

## 2017-08-29 ENCOUNTER — Emergency Department (HOSPITAL_COMMUNITY): Payer: Self-pay

## 2017-08-29 ENCOUNTER — Emergency Department (HOSPITAL_COMMUNITY)
Admission: EM | Admit: 2017-08-29 | Discharge: 2017-08-29 | Disposition: A | Discharge status: Home / Self Care | Payer: Self-pay | Attending: Emergency Medicine | Admitting: Emergency Medicine

## 2017-08-29 ENCOUNTER — Encounter (HOSPITAL_COMMUNITY): Payer: Self-pay | Admitting: Emergency Medicine

## 2017-08-29 DIAGNOSIS — J069 Acute upper respiratory infection, unspecified: Secondary | ICD-10-CM | POA: Insufficient documentation

## 2017-08-29 DIAGNOSIS — B9789 Other viral agents as the cause of diseases classified elsewhere: Secondary | ICD-10-CM | POA: Insufficient documentation

## 2017-08-29 DIAGNOSIS — J4541 Moderate persistent asthma with (acute) exacerbation: Secondary | ICD-10-CM | POA: Insufficient documentation

## 2017-08-29 DIAGNOSIS — Z79899 Other long term (current) drug therapy: Secondary | ICD-10-CM | POA: Insufficient documentation

## 2017-08-29 LAB — HCG, SERUM, QUALITATIVE: PREG SERUM: NEGATIVE

## 2017-08-29 LAB — BASIC METABOLIC PANEL
ANION GAP: 8 (ref 5–15)
BUN: 10 mg/dL (ref 6–20)
CHLORIDE: 105 mmol/L (ref 101–111)
CO2: 24 mmol/L (ref 22–32)
Calcium: 8.6 mg/dL — ABNORMAL LOW (ref 8.9–10.3)
Creatinine, Ser: 0.56 mg/dL (ref 0.44–1.00)
GFR calc Af Amer: >60 mL/min (ref >60)
GFR calc non Af Amer: >60 mL/min (ref >60)
Glucose, Bld: 101 mg/dL — ABNORMAL HIGH (ref 65–99)
POTASSIUM: 4 mmol/L (ref 3.5–5.1)
SODIUM: 137 mmol/L (ref 135–145)

## 2017-08-29 LAB — I-STAT BETA HCG BLOOD, ED (MC, WL, AP ONLY): HCG, QUANTITATIVE: 5.9 m[IU]/mL — AB (ref <5)

## 2017-08-29 LAB — CBC
HCT: 34 % — ABNORMAL LOW (ref 36.0–46.0)
HEMOGLOBIN: 10.7 g/dL — AB (ref 12.0–15.0)
MCH: 25.7 pg — AB (ref 26.0–34.0)
MCHC: 31.5 g/dL (ref 30.0–36.0)
MCV: 81.7 fL (ref 78.0–100.0)
Platelets: 278 10*3/uL (ref 150–400)
RBC: 4.16 MIL/uL (ref 3.87–5.11)
RDW: 16 % — ABNORMAL HIGH (ref 11.5–15.5)
WBC: 7.4 10*3/uL (ref 4.0–10.5)

## 2017-08-29 LAB — INFLUENZA PANEL BY PCR (TYPE A & B)
INFLAPCR: NEGATIVE
INFLBPCR: NEGATIVE

## 2017-08-29 MED ORDER — IPRATROPIUM-ALBUTEROL 0.5-2.5 (3) MG/3ML IN SOLN
3.0000 mL | Freq: Once | RESPIRATORY_TRACT | Status: AC
  Administered 2017-08-29: 3 mL via RESPIRATORY_TRACT
  Filled 2017-08-29: qty 3

## 2017-08-29 MED ORDER — ALBUTEROL SULFATE (2.5 MG/3ML) 0.083% IN NEBU
5.0000 mg | INHALATION_SOLUTION | Freq: Once | RESPIRATORY_TRACT | Status: AC
  Administered 2017-08-29: 5 mg via RESPIRATORY_TRACT
  Filled 2017-08-29: qty 6

## 2017-08-29 MED ORDER — SODIUM CHLORIDE 0.9 % IV BOLUS
500.0000 mL | Freq: Once | INTRAVENOUS | Status: AC
  Administered 2017-08-29: 500 mL via INTRAVENOUS

## 2017-08-29 MED ORDER — SODIUM CHLORIDE 0.9 % IV SOLN
INTRAVENOUS | Status: DC
  Administered 2017-08-29: 450 mL via INTRAVENOUS

## 2017-08-29 MED ORDER — METHYLPREDNISOLONE SODIUM SUCC 125 MG IJ SOLR
125.0000 mg | Freq: Once | INTRAMUSCULAR | Status: AC
  Administered 2017-08-29: 125 mg via INTRAVENOUS
  Filled 2017-08-29: qty 2

## 2017-08-29 MED ORDER — PREDNISONE 10 MG PO TABS: 40.0000 mg | ORAL_TABLET | Freq: Every day | ORAL | 0 refills | Status: AC

## 2017-08-29 NOTE — ED Notes (Signed)
Bed: WA06 Expected date:  Expected time:  Means of arrival:  Comments: 

## 2017-08-29 NOTE — ED Provider Notes (Signed)
[facility]-EMERGENCY DEPT Provider Note   CSN: [id number] Arrival date & time: 08/29/17  1027     History   Chief Complaint Chief Complaint  Patient presents with  . Asthma  . Shortness of Breath    HPI Carrie [name] is a 22 y.o. female.  Patient with known history of asthma.  Patient also with upper respiratory infection.  Has had some flu exposure.  Patient with increased shortness of breath.  Uses albuterol and nebulizers at home.  Primary care doctor started her on doxycycline 2 days ago.     Past Medical History:  Diagnosis Date  . Allergic rhinitis due to animal dander   . Asthma   . Carpal tunnel syndrome    bilateral  . Fibromyalgia   . GERD (gastroesophageal reflux disease)   . Migraines   . Pseudotumor cerebri     Patient Active Problem List   Diagnosis Date Noted  . Asthma, moderate persistent 12/31/2015  . LLL pneumonia (HCC) 11/11/2015  . SOB (shortness of breath)   . Asthma exacerbation 11/06/2015  . Depression 11/06/2015  . Hypokalemia 11/06/2015  . Pseudotumor cerebri 10/26/2013  . 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 Migraine 10/26/2013  . New daily persistent headache 10/26/2013  . Myofascial pain syndrome 10/26/2013  . Morbid obesity (HCC) 10/26/2013  . Papilledema 03/13/2013    Past Surgical History:  Procedure Laterality Date  . WISDOM TOOTH EXTRACTION       OB History   None      Home Medications    Prior to Admission medications   Medication Sig Start Date End Date Taking? Authorizing Provider  albuterol (PROVENTIL HFA;VENTOLIN HFA) 108 (90 Base) MCG/ACT inhaler Inhale 2 puffs into the lungs every 6 (six) hours as needed for wheezing or shortness of breath. 11/15/15  Yes Vassie LollMadera, Carlos, MD  albuterol (PROVENTIL) (2.5 MG/3ML) 0.083% nebulizer solution Take 3 mLs (2.5 mg total) by nebulization every 6 (six) hours as needed for wheezing or shortness of breath.  11/15/15  Yes Vassie LollMadera, Carlos, MD  cetirizine (ZYRTEC) 10 MG tablet Take 10 mg by mouth daily.     Yes [provider]  fluticasone (FLONASE) 50 MCG/ACT nasal spray Place 1 spray into both nostrils daily. 11/15/15  Yes Vassie LollMadera, Carlos, MD  Fluticasone-Salmeterol (ADVAIR DISKUS) 250-50 MCG/DOSE AEPB Inhale 1 puff into the lungs 2 (two) times daily. 12/31/15  Yes Mannam, Praveen, MD  furosemide (LASIX) 40 MG tablet Take 1 tablet (40 mg total) by mouth daily. 11/15/15  Yes Vassie LollMadera, Carlos, MD  montelukast (SINGULAIR) 10 MG tablet Take 1 tablet (10 mg total) by mouth at bedtime. 11/15/15  Yes Vassie LollMadera, Carlos, MD  Olopatadine HCl (PATADAY) 0.2 % SOLN Place 2 drops into both eyes daily.   Yes [provider]  pantoprazole (PROTONIX) 40 MG tablet Take 1 tablet (40 mg total) by mouth daily. 11/15/15  Yes Vassie LollMadera, Carlos, MD  polyethylene glycol powder (GLYCOLAX/MIRALAX) powder Take 17 g by mouth daily. 09/30/15  Yes [provider]  beclomethasone (QVAR) 80 MCG/ACT inhaler Inhale 2 puffs into the lungs 2 (two) times daily. 01/07/16   Mannam, Colbert CoyerPraveen, MD  ibuprofen (ADVIL,MOTRIN) 800 MG tablet Take 800 mg by mouth every 6 (six) hours as needed for fever, headache, mild pain, moderate pain or cramping.    [provider]  predniSONE (DELTASONE) 10 MG tablet Take 4 tablets (40 mg total) by mouth daily. 08/29/17   Vanetta MuldersZackowski, Kayston Jodoin, MD  topiramate (TOPAMAX) 50 MG  tablet Take 1 tablet (50 mg total) by mouth 2 (two) times daily. 07/13/17   Penumalli, Glenford Bayley, MD  traMADol (ULTRAM) 50 MG tablet Take 2 tablets (100 mg total) by mouth every 8 (eight) hours as needed for severe pain. 11/15/15   Vassie Loll, MD    Family History Family History  Problem Relation Age of Onset  . Migraines Mother   . Thyroid cancer Mother   . Fibromyalgia Mother   . Diabetes Mother   . Cancer Mother        thyroid  . Heart disease Mother   . Arthritis/Rheumatoid Mother   . Other Mother   . Hypertension  Father   . Diabetes Father   . Fibromyalgia Sister   . Migraines Maternal Aunt   . Stroke Maternal Grandmother   . Hypertension Maternal Grandmother   . Diabetes Maternal Grandmother   . Heart disease Maternal Grandmother   . Hypertension Maternal Grandfather   . Diabetes Maternal Grandfather   . Heart disease Maternal Grandfather   . Hypertension Paternal Grandmother   . Diabetes Paternal Grandmother   . Hypertension Paternal Grandfather   . Diabetes Paternal Grandfather   . Other Cousin        Myasthenia gravis    Social History Social History   Tobacco Use  . Smoking status: Never Smoker  . Smokeless tobacco: Never Used  Substance Use Topics  . Alcohol use: Yes    Comment: occass  . Drug use: No     Allergies   Kiwi extract; Pineapple; Other; and Percocet [oxycodone-acetaminophen]   Review of Systems Review of Systems  Constitutional: Positive for fever.  HENT: Positive for congestion.   Eyes: Negative for redness.  Respiratory: Positive for cough, chest tightness and shortness of breath.   Cardiovascular: Positive for chest pain.  Gastrointestinal: Negative for abdominal pain.  Genitourinary: Negative for dysuria.  Musculoskeletal: Negative for myalgias.  Skin: Negative for rash.  Neurological: Negative for headaches.  Hematological: Does not bruise/bleed easily.  Psychiatric/Behavioral: Negative for confusion.     Physical Exam Updated Vital Signs BP (!) 132/121 (BP Location: Left Arm)   Pulse 96   Temp 99.6 F (37.6 C) (Oral)   Resp (!) 24   Ht 1.524 m (5')   Wt (!) 145.2 kg (320 lb)   LMP 08/23/2017   SpO2 100%   BMI 62.50 kg/m   Physical Exam  Constitutional: She is oriented to person, place, and time. She appears well-developed and well-nourished. No distress.  HENT:  Head: Normocephalic and atraumatic.  Mouth/Throat: Oropharynx is clear and moist.  Eyes: Pupils are equal, round, and reactive to light. Conjunctivae and EOM are normal.    Neck: Neck supple.  Cardiovascular: Regular rhythm and normal heart sounds.  Slightly tachycardic.  Pulmonary/Chest: Effort normal. She has wheezes.  Abdominal: Soft. Bowel sounds are normal. There is no tenderness.  Neurological: She is alert and oriented to person, place, and time. No cranial nerve deficit or sensory deficit. She exhibits normal muscle tone. Coordination normal.  Skin: Skin is warm.  Nursing note and vitals reviewed.    ED Treatments / Results  Labs (all labs ordered are listed, but only abnormal results are displayed) Labs Reviewed  CBC - Abnormal; Notable for the following components:      Result Value   Hemoglobin 10.7 (*)    HCT 34.0 (*)    MCH 25.7 (*)    RDW 16.0 (*)    All other components within normal  limits  BASIC METABOLIC PANEL - Abnormal; Notable for the following components:   Glucose, Bld 101 (*)    Calcium 8.6 (*)    All other components within normal limits  I-STAT BETA HCG BLOOD, ED ([facility], [facility], [facility] ONLY) - Abnormal; Notable for the following components:   I-stat hCG, quantitative 5.9 (*)    All other components within normal limits  INFLUENZA PANEL BY PCR (TYPE A & B)  HCG, SERUM, QUALITATIVE    EKG EKG Interpretation  Date/Time:  Sunday August 29 2017 10:43:48 EDT Ventricular Rate:  102 PR Interval:    QRS Duration: 79 QT Interval:  401 QTC Calculation: 523 R Axis:   8 Text Interpretation:  Sinus tachycardia Borderline abnrm T, anterolateral leads Prolonged QT interval Confirmed by Mareena Cavan ([id number]) on 08/29/2017 1:14:52 PM   Radiology Dg Chest 2 View  Result Date: 08/29/2017 CLINICAL DATA:  Cough and shortness of breath since last night, back pain LEFT side, history LEFT lower lobe pneumonia, asthma, GERD, fibromyalgia EXAM: CHEST - 2 VIEW COMPARISON:  11/08/2015 FINDINGS: Mild enlargement of cardiac silhouette, likely accentuated by hypoinflation. Mediastinal contours and pulmonary vascularity normal. Bibasilar atelectasis.  Lungs otherwise clear. No pleural effusion or pneumothorax. Bones unremarkable. IMPRESSION: Bibasilar atelectasis. Electronically Signed   By: Mark  Boles M.D.   On: 08/29/2017 11:33    Procedures Procedures (including critical care time)  Medications Ordered in ED Medications  0.9 %  sodium chloride infusion (450 mLs Intravenous New Bag/Given 08/29/17 1422)  albuterol (PROVENTIL) (2.5 MG/3ML) 0.083% nebulizer solution 5 mg (5 mg Nebulization Given 08/29/17 1057)  ipratropium-albuterol (DUONEB) 0.5-2.5 (3) MG/3ML nebulizer solution 3 mL (3 mLs Nebulization Given 08/29/17 1430)  sodium chloride 0.9 % bolus 500 mL (0 mLs Intravenous Stopped 08/29/17 1516)  methylPREDNISolone sodium succinate (SOLU-MEDROL) 125 mg/2 mL injection 125 mg (125 mg Intravenous Given 08/29/17 1429)  ipratropium-albuterol (DUONEB) 0.5-2.5 (3) MG/3ML nebulizer solution 3 mL (3 mLs Nebulization Given 08/29/17 1704)     Initial Impression / Assessment and Plan / ED Course  I have reviewed the triage vital signs and the nursing notes.  Pertinent labs & imaging results that were available during my care of the patient were reviewed by me and considered in my medical decision making (see chart for details).     Chest x-ray negative for pneumonia.  Flu test negative.  Patient also received 3 nebulizers first 1 just albuterol next 2 albuterol and Atrovent.  Overall improvement.  Patient feels well enough to go home.  Patient received IV Solu-Medrol.  Patient continued on prednisone.  She will return for any new or worse symptoms.  Do not think symptoms are consistent with a pulmonary embolus seems to be more upper respiratory infection and acute exacerbation of her asthma.  Wheezing has resolved.  Final Clinical Impressions(s) / ED Diagnoses   Final diagnoses:  Moderate persistent asthma with exacerbation  Viral upper respiratory tract infection    ED Discharge Orders        Ordered    predniSONE (DELTASONE) 10 MG  tablet  Daily     04 /14/19 1739       Vanetta Mulders, MD 08/29/17 1746

## 2017-08-29 NOTE — Discharge Instructions (Addendum)
Take the prednisone as directed for the next 5 days.  Work note provided to be out of work for the next 2 days.  Continue to use your albuterol treatments at home.  Return for any new or worse symptoms.  Make an appointment to follow-up with your doctor.

## 2017-08-29 NOTE — ED Triage Notes (Addendum)
Pt has asthma and for past week thought it was just cold symptoms but now causing her SOB with her asthma using neb and inhaler not helping.  Reports that using machine when she had PNA last year with her breathing but not helping her chest feel tight and SOB.

## 2017-11-12 ENCOUNTER — Ambulatory Visit: Payer: Medicaid Other | Admitting: Diagnostic Neuroimaging

## 2018-02-02 ENCOUNTER — Emergency Department (HOSPITAL_COMMUNITY): Payer: Self-pay

## 2018-02-02 ENCOUNTER — Encounter (HOSPITAL_COMMUNITY): Payer: Self-pay

## 2018-02-02 ENCOUNTER — Other Ambulatory Visit: Payer: Self-pay

## 2018-02-02 ENCOUNTER — Emergency Department (HOSPITAL_COMMUNITY)
Admission: EM | Admit: 2018-02-02 | Discharge: 2018-02-02 | Disposition: A | Discharge status: Home / Self Care | Payer: Self-pay | Attending: Emergency Medicine | Admitting: Emergency Medicine

## 2018-02-02 DIAGNOSIS — M79672 Pain in left foot: Secondary | ICD-10-CM | POA: Insufficient documentation

## 2018-02-02 DIAGNOSIS — Z79899 Other long term (current) drug therapy: Secondary | ICD-10-CM | POA: Insufficient documentation

## 2018-02-02 DIAGNOSIS — J45909 Unspecified asthma, uncomplicated: Secondary | ICD-10-CM | POA: Insufficient documentation

## 2018-02-02 NOTE — ED Provider Notes (Signed)
[facility]-EMERGENCY DEPT Provider Note  CSN: [id number] Arrival date & time: 02/02/18  0907    History   Chief Complaint Chief Complaint  Patient presents with  . Foot Pain    HPI Carrie [name] is a 22 y.o. female with a medical history of fibromyalgia, asthma, GERD, migraines and pseudotumor cerebri who presents with left foot pain. Onset of the symptoms was 2 weeks ago. Precipitating event: none known injury, but reports standing and walking a lot at work. Current symptoms include: ability to bear weight, but with some pain and pain at the lateral aspect of the ankle. Aggravating factors: any weight bearing. Denies paresthesias, foot drop, weakness, gait/coordination/balance issues. Symptoms have been intermittent. Patient has had prior foot problems and reports fracturing her left ankle. Evaluation to date: none. Treatment to date: none.       Past Medical History:  Diagnosis Date  . Allergic rhinitis due to animal dander   . Asthma   . Carpal tunnel syndrome    bilateral  . Fibromyalgia   . GERD (gastroesophageal reflux disease)   . Migraines   . Pseudotumor cerebri     Patient Active Problem List   Diagnosis Date Noted  . Asthma, moderate persistent 12/31/2015  . LLL pneumonia (HCC) 11/11/2015  . SOB (shortness of breath)   . Asthma exacerbation 11/06/2015  . Depression 11/06/2015  . Hypokalemia 11/06/2015  . Pseudotumor cerebri 10/26/2013  . 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 Migraine 10/26/2013  . New daily persistent headache 10/26/2013  . Myofascial pain syndrome 10/26/2013  . Morbid obesity (HCC) 10/26/2013  . Papilledema 03/13/2013    Past Surgical History:  Procedure Laterality Date  . WISDOM TOOTH EXTRACTION       OB History   None      Home Medications    Prior to Admission medications   Medication Sig Start Date End Date Taking? Authorizing Provider  albuterol (PROVENTIL  HFA;VENTOLIN HFA) 108 (90 Base) MCG/ACT inhaler Inhale 2 puffs into the lungs every 6 (six) hours as needed for wheezing or shortness of breath. 11/15/15   Vassie Loll, MD  albuterol (PROVENTIL) (2.5 MG/3ML) 0.083% nebulizer solution Take 3 mLs (2.5 mg total) by nebulization every 6 (six) hours as needed for wheezing or shortness of breath. 11/15/15   Vassie Loll, MD  beclomethasone (QVAR) 80 MCG/ACT inhaler Inhale 2 puffs into the lungs 2 (two) times daily. 01/07/16   Mannam, Colbert Coyer, MD  cetirizine (ZYRTEC) 10 MG tablet Take 10 mg by mouth daily.      [provider]  fluticasone (FLONASE) 50 MCG/ACT nasal spray Place 1 spray into both nostrils daily. 11/15/15   Vassie Loll, MD  Fluticasone-Salmeterol (ADVAIR DISKUS) 250-50 MCG/DOSE AEPB Inhale 1 puff into the lungs 2 (two) times daily. 12/31/15   Mannam, Colbert Coyer, MD  furosemide (LASIX) 40 MG tablet Take 1 tablet (40 mg total) by mouth daily. 11/15/15   Vassie Loll, MD  ibuprofen (ADVIL,MOTRIN) 800 MG tablet Take 800 mg by mouth every 6 (six) hours as needed for fever, headache, mild pain, moderate pain or cramping.    [provider]  montelukast (SINGULAIR) 10 MG tablet Take 1 tablet (10 mg total) by mouth at bedtime. 11/15/15   Vassie Loll, MD  Olopatadine HCl (PATADAY) 0.2 % SOLN Place 2 drops into both eyes daily.    [provider]  pantoprazole (PROTONIX) 40 MG tablet Take 1 tablet (40 mg total) by mouth daily. 11/15/15  Vassie Loll, MD  polyethylene glycol powder (GLYCOLAX/MIRALAX) powder Take 17 g by mouth daily. 09/30/15   [provider]  predniSONE (DELTASONE) 10 MG tablet Take 4 tablets (40 mg total) by mouth daily. 08/29/17   Vanetta Mulders, MD  topiramate (TOPAMAX) 50 MG tablet Take 1 tablet (50 mg total) by mouth 2 (two) times daily. 07/13/17   Penumalli, Glenford Bayley, MD  traMADol (ULTRAM) 50 MG tablet Take 2 tablets (100 mg total) by mouth every 8 (eight) hours as needed for severe pain.  11/15/15   Vassie Loll, MD    Family History Family History  Problem Relation Age of Onset  . Migraines Mother   . Thyroid cancer Mother   . Fibromyalgia Mother   . Diabetes Mother   . Cancer Mother        thyroid  . Heart disease Mother   . Arthritis/Rheumatoid Mother   . Other Mother   . Hypertension Father   . Diabetes Father   . Fibromyalgia Sister   . Migraines Maternal Aunt   . Stroke Maternal Grandmother   . Hypertension Maternal Grandmother   . Diabetes Maternal Grandmother   . Heart disease Maternal Grandmother   . Hypertension Maternal Grandfather   . Diabetes Maternal Grandfather   . Heart disease Maternal Grandfather   . Hypertension Paternal Grandmother   . Diabetes Paternal Grandmother   . Hypertension Paternal Grandfather   . Diabetes Paternal Grandfather   . Other Cousin        Myasthenia gravis    Social History Social History   Tobacco Use  . Smoking status: Never Smoker  . Smokeless tobacco: Never Used  Substance Use Topics  . Alcohol use: Yes    Comment: occass  . Drug use: No     Allergies   Kiwi extract; Pineapple; Other; and Percocet [oxycodone-acetaminophen]   Review of Systems Review of Systems  Constitutional: Negative.   Musculoskeletal: Positive for arthralgias. Negative for gait problem and myalgias.  Skin: Negative.   Neurological: Negative for weakness and numbness.  Hematological: Negative.    Physical Exam Updated Vital Signs BP 121/66   Pulse 88   Resp 16   Ht 5\' 1"  (1.549 m)   Wt (!) 145.2 kg   SpO2 100%   BMI 60.46 kg/m   Physical Exam  Constitutional: She appears well-developed and well-nourished. No distress.  Obese  Cardiovascular:  Pulses:      Dorsalis pedis pulses are 2+ on the right side, and 2+ on the left side.       Posterior tibial pulses are 2+ on the right side, and 2+ on the left side.  Musculoskeletal:       Left ankle: She exhibits normal range of motion and no swelling. No tenderness.  Achilles tendon normal.       Left foot: There is tenderness. There is normal range of motion, no bony tenderness, no swelling and normal capillary refill.  Able to ambulate and bear weight without issue or assistance. Full ROM in toes and ankles bilaterally with 5/5 strength. No swelling, bony tenderness or deformities appreciated.  Neurological: She has normal strength. No sensory deficit. She exhibits normal muscle tone. Gait normal.  Reflex Scores:      Achilles reflexes are 2+ on the right side and 2+ on the left side. Skin: Skin is warm and intact. Capillary refill takes less than 2 seconds. No bruising noted.  Nursing note and vitals reviewed.  ED Treatments / Results  Labs (  all labs ordered are listed, but only abnormal results are displayed) Labs Reviewed - No data to display  EKG None  Radiology No results found.  Procedures Procedures (including critical care time)  Medications Ordered in ED Medications - No data to display   Initial Impression / Assessment and Plan / ED Course  Triage vital signs and the nursing notes have been reviewed.  Pertinent labs & imaging results that were available during care of the patient were reviewed and considered in medical decision making (see chart for details).   Patient is in no distress and well appearing. Patient has full sensation in left foot. She also has full active and passive ROM. No deformities, decreased muscle tone or other abnormalities visualized. Neurovascular function is intact. Physical exam is reassuring as patient only has muscular tenderness along the lateral aspect of the foot. When patient ambulates, she tends to have more inversion and this is also evidenced by the wear pattern on her shoes. Pain likely 2/2 to strain due to excessive use at work and additional load due to patient's body habitus. There are no other physical exam findings or s/s that suggest an underlying infectious or rheumatologic process that  warrant further evaluation or intervention today.  Final Clinical Impressions(s) / ED Diagnoses  1. Left Foot Pain. Education provided on OTC and supportive treatment for pain relief and inflammation. Referral to podiatry given for further evaluation.  Dispo: Home. After thorough clinical evaluation, this patient is determined to be medically stable and can be safely discharged with the previously mentioned treatment and/or outpatient follow-up/referral(s). At this time, there are no other apparent medical conditions that require further screening, evaluation or treatment.   Final diagnoses:  Foot pain, left    ED Discharge Orders    None        Reva BoresMortis, Gabrielle I, PA-C 02/02/18 1021    Arby BarrettePfeiffer, Marcy, MD 02/03/18 [phone]

## 2018-02-02 NOTE — Discharge Instructions (Addendum)
I have no concerns that you have any broken or dislocated bones in your foot. As we discussed, you likely have muscle or ligament strain in the structures that support your ankle.  You may use Tylenol and/or Ibuprofen for pain relief and swelling. You may also use warm or cold compresses for additional relief in 15-20 minute intervals. You may follow-up with your PCP if you continue to have issues for more than 4-6 weeks. I have also listed a podiatry (foot specialist) group that you can follow-up with.   You may also wear an ankle brace that ties up to offer additional support and it can be worn at work.  Think about buying new shoes that have better support. New Balance and [employer]ics are good options.  Thank you for allowing me to take care of you today!

## 2018-02-02 NOTE — ED Triage Notes (Signed)
Pt c/o worsening left foot pain over last two weeks. Recently began job that requires being on her feet all day.

## 2019-08-31 ENCOUNTER — Encounter (HOSPITAL_COMMUNITY): Payer: Self-pay

## 2019-08-31 ENCOUNTER — Ambulatory Visit (INDEPENDENT_AMBULATORY_CARE_PROVIDER_SITE_OTHER): Payer: BLUE CROSS/BLUE SHIELD

## 2019-08-31 ENCOUNTER — Other Ambulatory Visit: Payer: Self-pay

## 2019-08-31 ENCOUNTER — Ambulatory Visit (HOSPITAL_COMMUNITY)
Admission: EM | Admit: 2019-08-31 | Discharge: 2019-08-31 | Disposition: A | Discharge status: Home / Self Care | Payer: BLUE CROSS/BLUE SHIELD | Attending: Physician Assistant | Admitting: Physician Assistant

## 2019-08-31 DIAGNOSIS — S8991XA Unspecified injury of right lower leg, initial encounter: Secondary | ICD-10-CM | POA: Diagnosis not present

## 2019-08-31 DIAGNOSIS — S59901A Unspecified injury of right elbow, initial encounter: Secondary | ICD-10-CM | POA: Diagnosis not present

## 2019-08-31 DIAGNOSIS — S93491A Sprain of other ligament of right ankle, initial encounter: Secondary | ICD-10-CM | POA: Diagnosis not present

## 2019-08-31 MED ORDER — IBUPROFEN 800 MG PO TABS: 800.0000 mg | ORAL_TABLET | Freq: Three times a day (TID) | ORAL | 0 refills | Status: AC

## 2019-08-31 NOTE — Discharge Instructions (Addendum)
There is possible evidence of an Occult Fracture in your Right elbow. I would like for you to wear the sling and schedule follow up with orthopedics  Take the ibuprofen for pain

## 2019-08-31 NOTE — ED Provider Notes (Signed)
[facility]    CSN: [id number] Arrival date & time: 08/31/19  0350      History   Chief Complaint Chief Complaint  Patient presents with  . Fall  . Elbow Pain  . Knee Injury    HPI Carrie [name] is a 24 y.o. female.   Patient presents for evaluation of right elbow, right knee and right ankle pain.  She reports yesterday she was walking down a hill when she rolled her ankle and fell directly onto her elbows and knees.  She has reported fairly severe pain in her right elbow and inability to fully extend it.  She also reports pain with turning her wrist over.  Denies numbness or tingling in the right hand.  She denies significant swelling of the elbow.  She has had some mild ankle pain however has been able to walk.  She reports pain with walking and bearing weight in the right knee and with movement of the knee.  She reports feeling popping in the right knee since yesterday as well.  She has minimal issues in the left knee and ankle.  She denies a history of knee injuries or issues with her knees.     Past Medical History:  Diagnosis Date  . Allergic rhinitis due to animal dander   . Asthma   . Carpal tunnel syndrome    bilateral  . Fibromyalgia   . GERD (gastroesophageal reflux disease)   . Migraines   . Pseudotumor cerebri     Patient Active Problem List   Diagnosis Date Noted  . Asthma, moderate persistent 12/31/2015  . LLL pneumonia 11/11/2015  . SOB (shortness of breath)   . Asthma exacerbation 11/06/2015  . Depression 11/06/2015  . Hypokalemia 11/06/2015  . Pseudotumor cerebri 10/26/2013  . 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 Migraine 10/26/2013  . New daily persistent headache 10/26/2013  . Myofascial pain syndrome 10/26/2013  . Morbid obesity ([place]) 10/26/2013  . Papilledema 03/13/2013    Past Surgical History:  Procedure Laterality Date  . WISDOM TOOTH EXTRACTION      OB History   No obstetric  history on file.      Home Medications    Prior to Admission medications   Medication Sig Start Date End Date Taking? Authorizing Provider  albuterol (PROVENTIL HFA;VENTOLIN HFA) 108 (90 Base) MCG/ACT inhaler Inhale 2 puffs into the lungs every 6 (six) hours as needed for wheezing or shortness of breath. 11/15/15   Barton Dubois, MD  albuterol (PROVENTIL) (2.5 MG/3ML) 0.083% nebulizer solution Take 3 mLs (2.5 mg total) by nebulization every 6 (six) hours as needed for wheezing or shortness of breath. 11/15/15   Barton Dubois, MD  beclomethasone (QVAR) 80 MCG/ACT inhaler Inhale 2 puffs into the lungs 2 (two) times daily. 01/07/16   Mannam, Hart Robinsons, MD  cetirizine (ZYRTEC) 10 MG tablet Take 10 mg by mouth daily.      [provider]  fluticasone (FLONASE) 50 MCG/ACT nasal spray Place 1 spray into both nostrils daily. 11/15/15   Barton Dubois, MD  Fluticasone-Salmeterol (ADVAIR DISKUS) 250-50 MCG/DOSE AEPB Inhale 1 puff into the lungs 2 (two) times daily. 12/31/15   Mannam, Hart Robinsons, MD  furosemide (LASIX) 40 MG tablet Take 1 tablet (40 mg total) by mouth daily. 11/15/15   Barton Dubois, MD  ibuprofen (ADVIL) 800 MG tablet Take 1 tablet (800 mg total) by mouth 3 (three) times daily. 08/31/19   Lynsee Wands, Marguerita Beards, PA-C  montelukast (SINGULAIR) 10  MG tablet Take 1 tablet (10 mg total) by mouth at bedtime. 11/15/15   Vassie Loll, MD  Olopatadine HCl (PATADAY) 0.2 % SOLN Place 2 drops into both eyes daily.    [provider]  pantoprazole (PROTONIX) 40 MG tablet Take 1 tablet (40 mg total) by mouth daily. 11/15/15   Vassie Loll, MD  polyethylene glycol powder (GLYCOLAX/MIRALAX) powder Take 17 g by mouth daily. 09/30/15   [provider]  predniSONE (DELTASONE) 10 MG tablet Take 4 tablets (40 mg total) by mouth daily. 08/29/17   Vanetta Mulders, MD  topiramate (TOPAMAX) 50 MG tablet Take 1 tablet (50 mg total) by mouth 2 (two) times daily. 07/13/17   Penumalli, Glenford Bayley, MD    traMADol (ULTRAM) 50 MG tablet Take 2 tablets (100 mg total) by mouth every 8 (eight) hours as needed for severe pain. 11/15/15   Vassie Loll, MD    Family History Family History  Problem Relation Age of Onset  . Migraines Mother   . Thyroid cancer Mother   . Fibromyalgia Mother   . Diabetes Mother   . Cancer Mother        thyroid  . Heart disease Mother   . Arthritis/Rheumatoid Mother   . Other Mother   . Hypertension Father   . Diabetes Father   . Fibromyalgia Sister   . Migraines Maternal Aunt   . Stroke Maternal Grandmother   . Hypertension Maternal Grandmother   . Diabetes Maternal Grandmother   . Heart disease Maternal Grandmother   . Hypertension Maternal Grandfather   . Diabetes Maternal Grandfather   . Heart disease Maternal Grandfather   . Hypertension Paternal Grandmother   . Diabetes Paternal Grandmother   . Hypertension Paternal Grandfather   . Diabetes Paternal Grandfather   . Other Cousin        Myasthenia gravis    Social History Social History   Tobacco Use  . Smoking status: Never Smoker  . Smokeless tobacco: Never Used  Substance Use Topics  . Alcohol use: Yes    Comment: occass  . Drug use: No     Allergies   Kiwi extract, Pineapple, Other, and Percocet [oxycodone-acetaminophen]   Review of Systems Review of Systems  Per HPI Physical Exam Triage Vital Signs ED Triage Vitals  Enc Vitals Group     BP 08/31/19 0956 119/73     Pulse Rate 08/31/19 0956 81     Resp 08/31/19 0956 16     Temp 08/31/19 0956 98.7 F (37.1 C)     Temp Source 08/31/19 0956 Oral     SpO2 08/31/19 0956 99 %     Weight 08/31/19 0958 (!) 320 lb (145.2 kg)     Height --      Head Circumference --      Peak Flow --      Pain Score 08/31/19 0958 6     Pain Loc --      Pain Edu? --      Excl. in GC? --    No data found.  Updated Vital Signs BP 119/73 (BP Location: Left Arm)   Pulse 81   Temp 98.7 F (37.1 C) (Oral)   Resp 16   Wt (!) 320 lb (145.2  kg)   LMP 08/21/2019   SpO2 99%   BMI 60.46 kg/m   Visual Acuity Right Eye Distance:   Left Eye Distance:   Bilateral Distance:    Right Eye Near:   Left Eye Near:  Bilateral Near:     Physical Exam Constitutional:      General: She is not in acute distress.    Appearance: Normal appearance. She is obese.  HENT:     Head: Normocephalic and atraumatic.  Eyes:     Extraocular Movements: Extraocular movements intact.     Pupils: Pupils are equal, round, and reactive to light.  Cardiovascular:     Rate and Rhythm: Normal rate.  Pulmonary:     Effort: Pulmonary effort is normal. No respiratory distress.  Musculoskeletal:     Comments: Tenderness to palpation over the right patella.  Pain with patellar movement however it is free moving.  Pain with terminal flexion of the right knee.  Feels stable.  Exam is limited due to body habitus unable to appreciate if there is effusion.  Right elbow with tenderness to palpation over the olecranon process and proximal forearm.  Unable to fully extend elbow due to pain.  There is pain elicited with forced pronation.  No pain with supination.  Pain with resisted extension of the elbow.  Pain improved with flexion and cradling of the right arm.    Skin:    General: Skin is warm and dry.  Neurological:     General: No focal deficit present.     Mental Status: She is alert and oriented to person, place, and time.     Gait: Gait abnormal.      UC Treatments / Results  Labs (all labs ordered are listed, but only abnormal results are displayed) Labs Reviewed - No data to display  EKG   Radiology DG Elbow Complete Right  Result Date: 08/31/2019 CLINICAL DATA:  Fall yesterday with right elbow pain. EXAM: RIGHT ELBOW - COMPLETE 3+ VIEW COMPARISON:  None. FINDINGS: No evidence of acute fracture or dislocation. Minimally displaced posterior fat pad. Remainder the exam is unremarkable. IMPRESSION: Minimally displaced posterior fat pad which  can be seen due to an occult fracture or joint effusion. No discrete fracture line visualized. Electronically Signed   By: Elberta Fortis M.D.   On: 08/31/2019 12:15   DG Knee AP/LAT W/Sunrise Right  Result Date: 08/31/2019 CLINICAL DATA:  Fall onto knee yesterday with patellar pain. EXAM: RIGHT KNEE 3 VIEWS COMPARISON:  None. FINDINGS: No evidence of fracture, dislocation, or joint effusion. No evidence of arthropathy or other focal bone abnormality. Soft tissues are unremarkable. IMPRESSION: Negative. Electronically Signed   By: Elberta Fortis M.D.   On: 08/31/2019 12:30    Procedures Procedures (including critical care time)  Medications Ordered in UC Medications - No data to display  Initial Impression / Assessment and Plan / UC Course  I have reviewed the triage vital signs and the nursing notes.  Pertinent labs & imaging results that were available during my care of the patient were reviewed by me and considered in my medical decision making (see chart for details).     #Right elbow injury-possible radial head fracture #Right knee injury #Right ankle sprain This is a 24 year old female presenting for evaluation all right-sided extremity injuries.  X-ray did show evidence of posterior fat pad sign in the right elbow.  When clinically correlated this could represent radial head fracture, however ago definitive radiologic evidence.  Will place in sling for comfort and have follow-up with orthopedics for further evaluation.  Knee x-ray was negative, however possible soft tissue injury given reported popping and pain.  Will have follow-up with orthopedics for this at time of elbow visit. -Placed in  sling discharge instructions utilize anti-inflammatory medications. -Patient verbalized understanding the results and plan. Final Clinical Impressions(s) / UC Diagnoses   Final diagnoses:  Injury of right knee, initial encounter  Sprain of anterior talofibular ligament of right ankle, initial  encounter  Injury of right elbow, initial encounter     Discharge Instructions     There is possible evidence of an Occult Fracture in your Right elbow. I would like for you to wear the sling and schedule follow up with orthopedics  Take the ibuprofen for pain        ED Prescriptions    Medication Sig Dispense Auth. Provider   ibuprofen (ADVIL) 800 MG tablet Take 1 tablet (800 mg total) by mouth 3 (three) times daily. 21 tablet Mykeisha Dysert, Veryl Speak, PA-C     PDMP not reviewed this encounter.   Hermelinda Medicus, PA-C 08/31/19 2258

## 2019-08-31 NOTE — ED Triage Notes (Signed)
Pt states she has was walking along and she rolled her right  ankle and her fell face first on the cement. Pt states she injured her right elbow and knee.

## 2020-04-30 IMAGING — DX DG KNEE AP/LAT W/ SUNRISE*R*
3 series · 3 of 3 positions shown · non-contrast
Comparison: None.

CLINICAL DATA: Fall onto knee yesterday with patellar pain.

EXAM:
RIGHT KNEE 3 VIEWS

[knee ap]
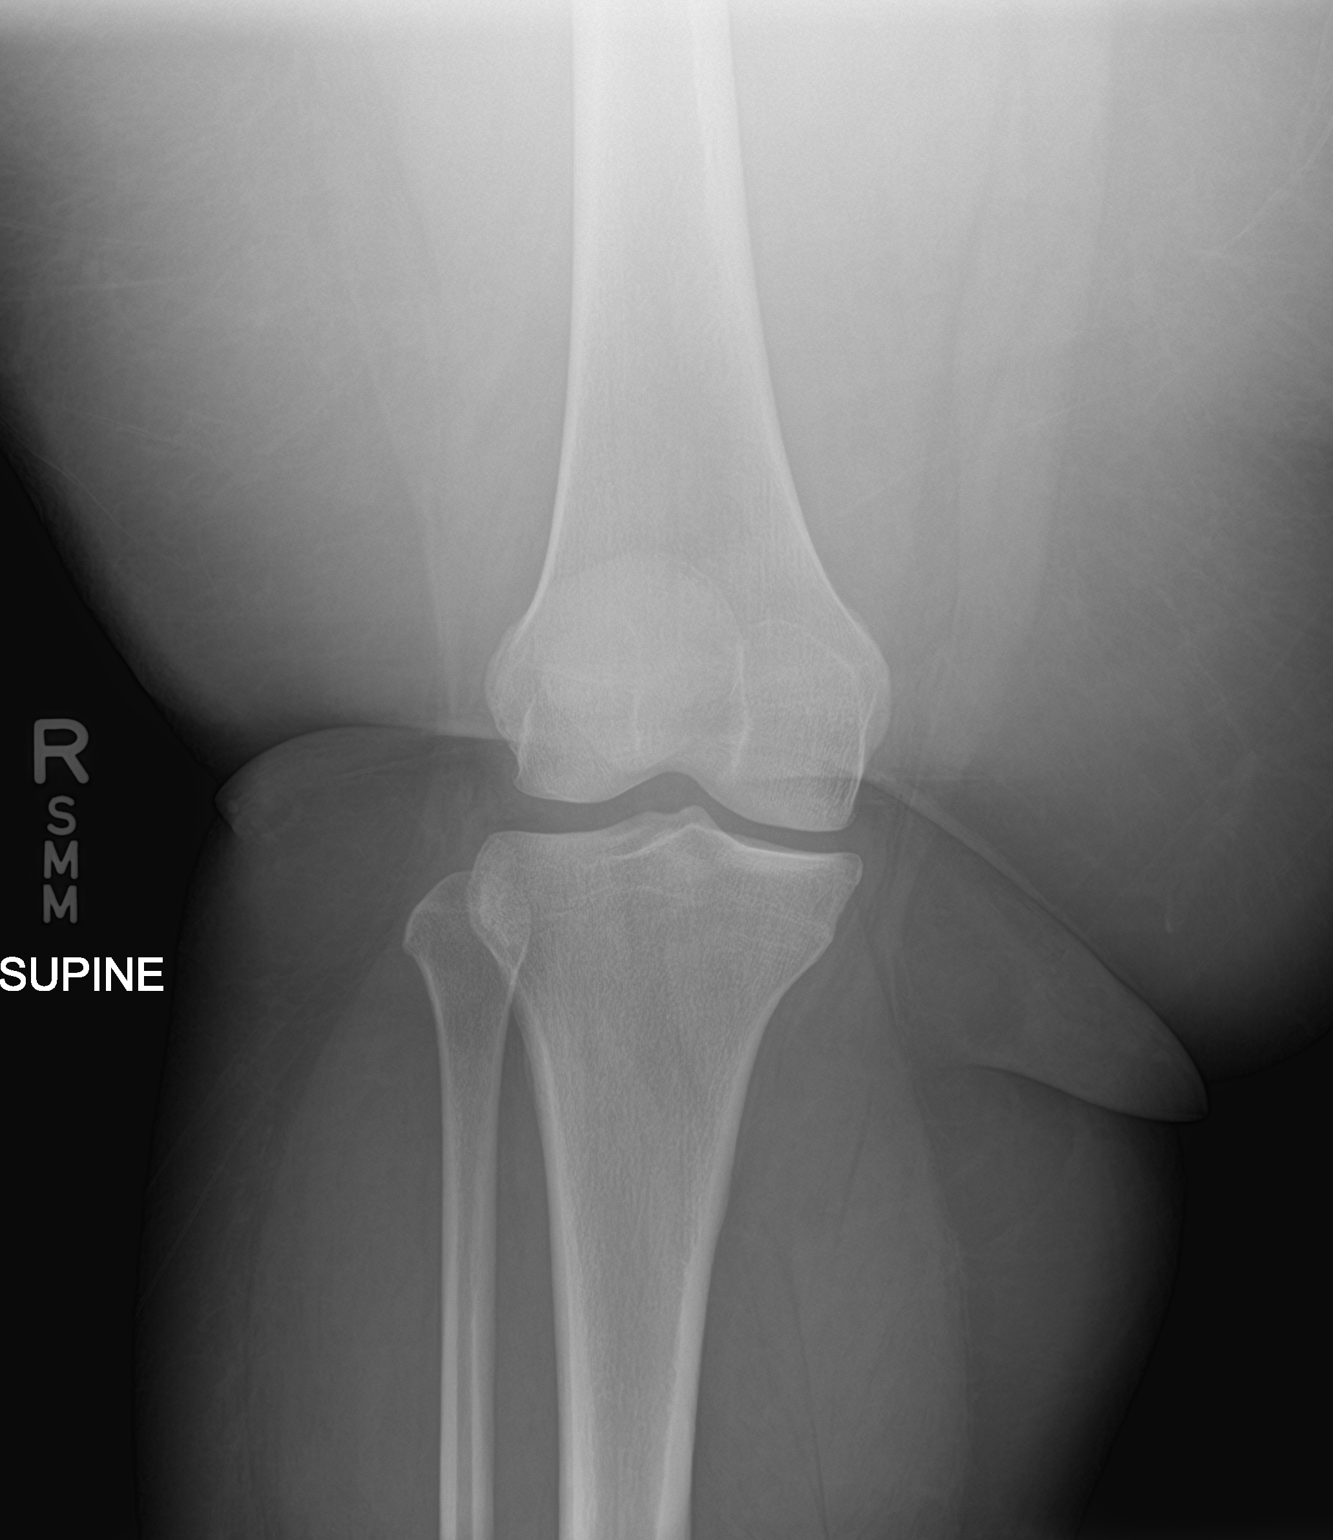

[knee lat]
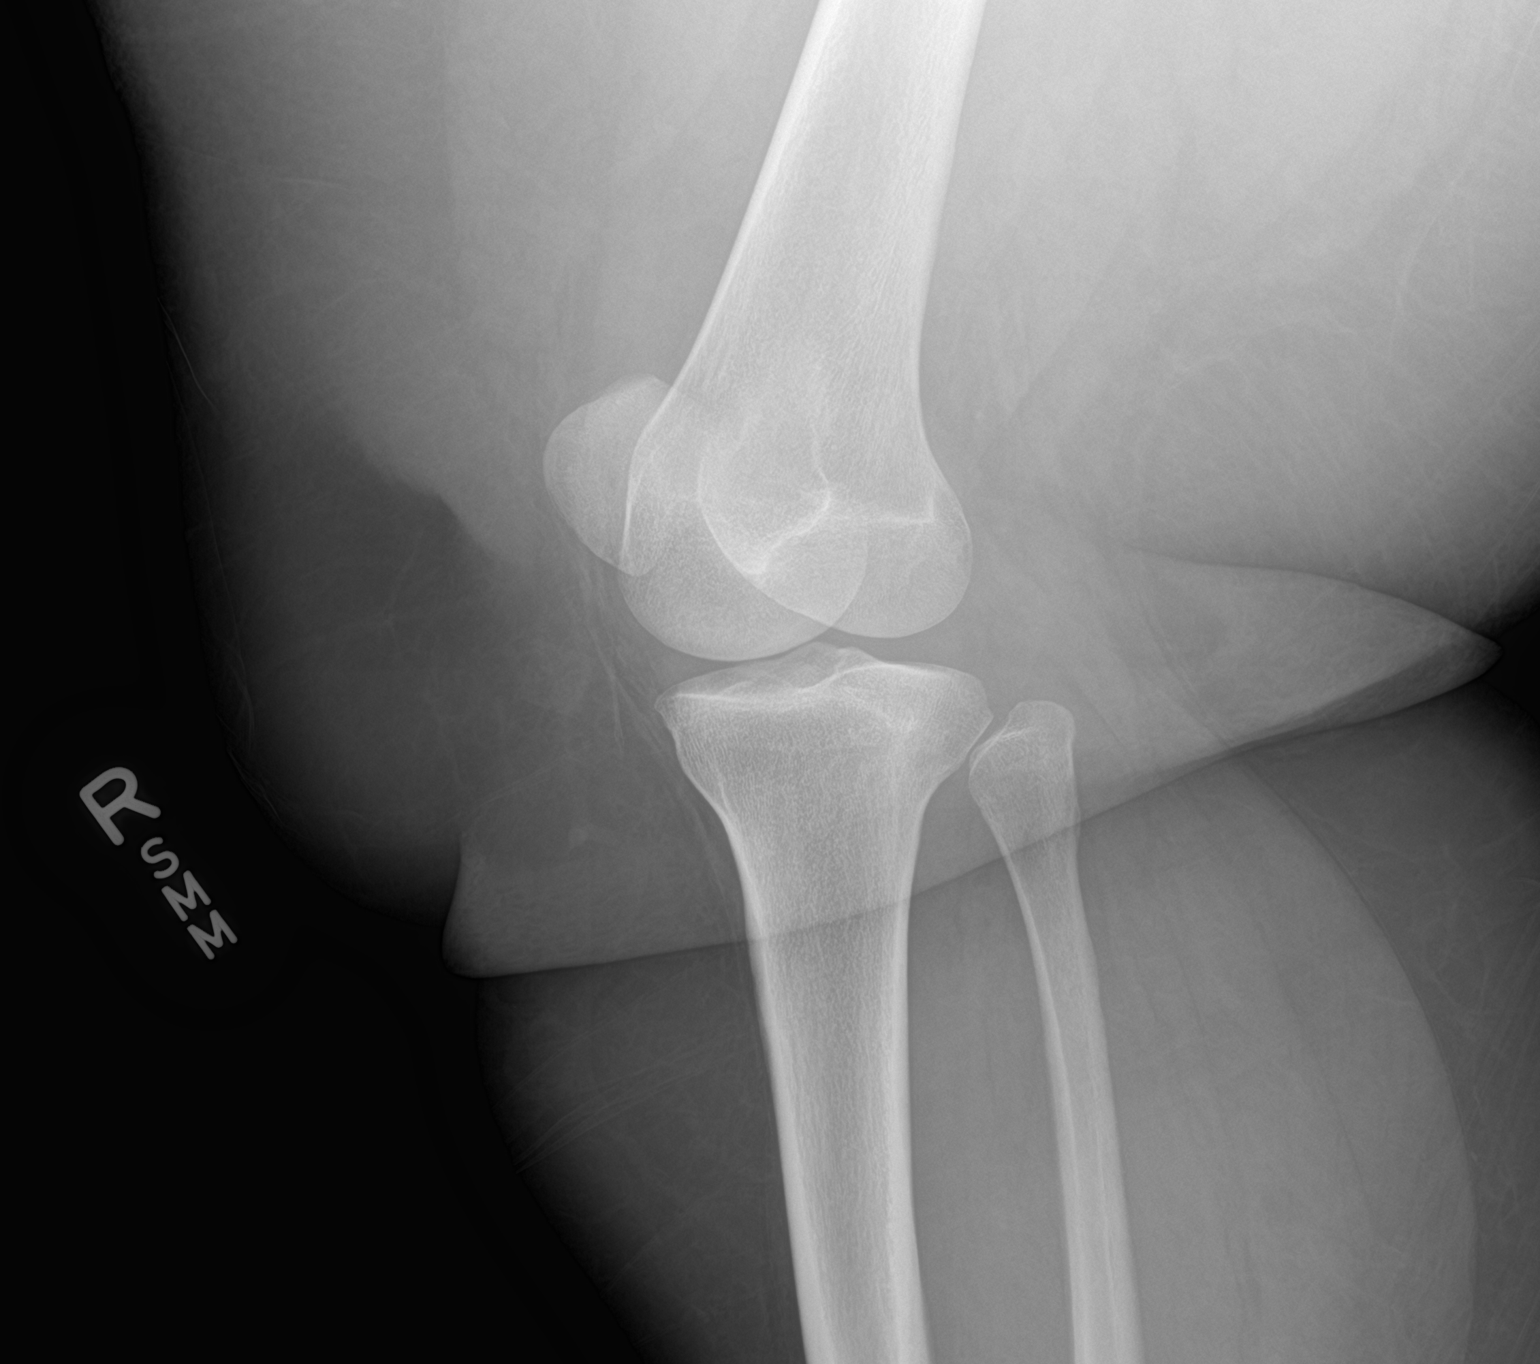

[knee sunrise]
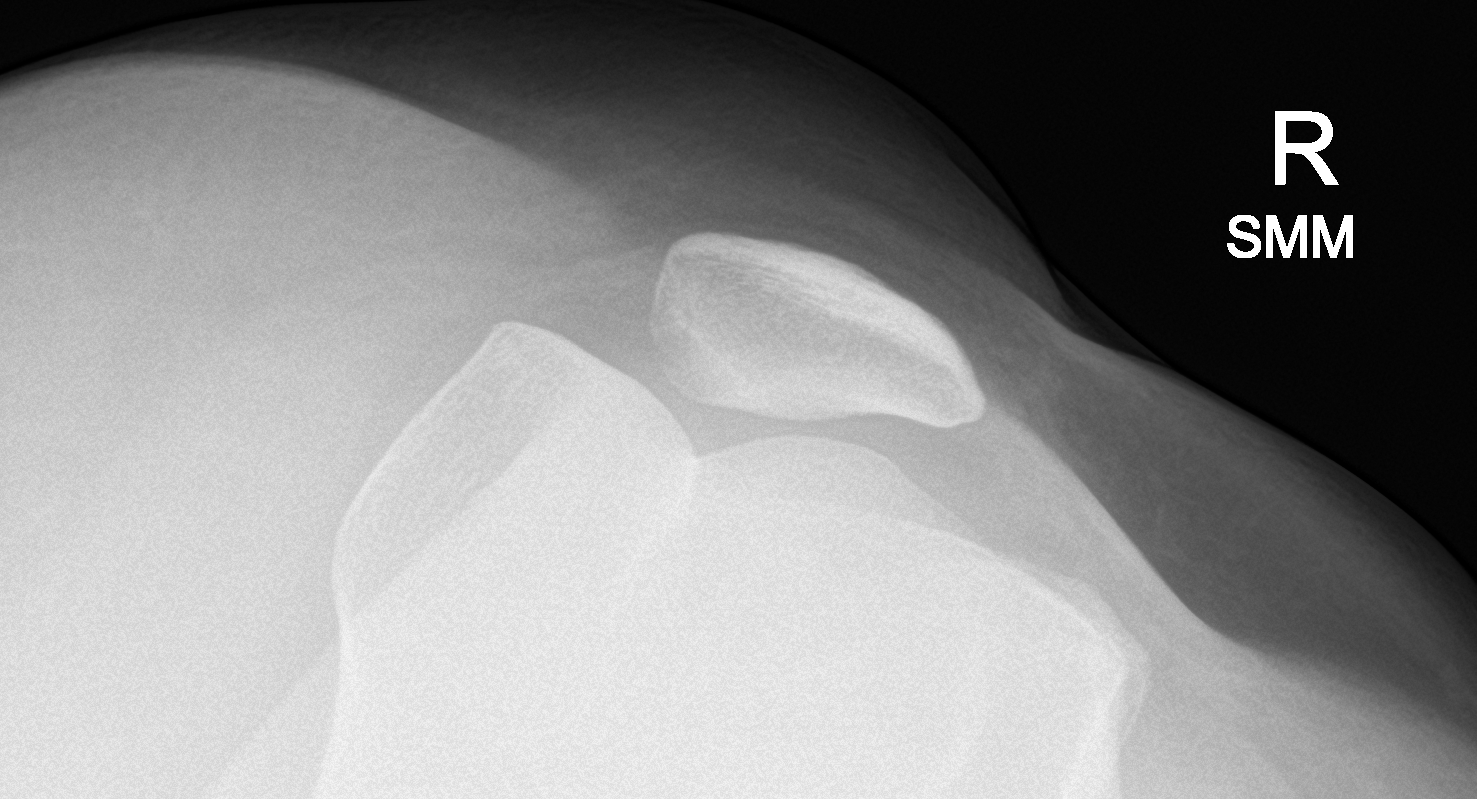

[3 of 3 positions shown; findings below may reference images not displayed]

FINDINGS: No evidence of fracture, dislocation, or joint effusion. No evidence
of arthropathy or other focal bone abnormality. Soft tissues are
unremarkable.
IMPRESSION: Negative.
